# Patient Record
Sex: Female | Born: 1995
Health system: Southern US, Community
[De-identification: ages and names within clinical notes are randomized; demographics above are authoritative.]

## PROBLEM LIST (undated history)

## (undated) DIAGNOSIS — E282 Polycystic ovarian syndrome: Secondary | ICD-10-CM

## (undated) DIAGNOSIS — F329 Major depressive disorder, single episode, unspecified: Secondary | ICD-10-CM

## (undated) DIAGNOSIS — F32A Depression, unspecified: Secondary | ICD-10-CM

## (undated) DIAGNOSIS — E119 Type 2 diabetes mellitus without complications: Secondary | ICD-10-CM

## (undated) DIAGNOSIS — D649 Anemia, unspecified: Secondary | ICD-10-CM

## (undated) HISTORY — DX: Depression, unspecified: F32.A

## (undated) HISTORY — PX: WISDOM TOOTH EXTRACTION: SHX21

## (undated) HISTORY — DX: Anemia, unspecified: D64.9

---
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
## 2016-03-16 ENCOUNTER — Ambulatory Visit: Payer: Medicaid Other | Admitting: Certified Nurse Midwife

## 2016-04-01 ENCOUNTER — Ambulatory Visit (INDEPENDENT_AMBULATORY_CARE_PROVIDER_SITE_OTHER): Payer: Medicaid Other | Admitting: Certified Nurse Midwife

## 2016-04-01 ENCOUNTER — Encounter: Payer: Self-pay | Admitting: Certified Nurse Midwife

## 2016-04-01 VITALS — BP 123/82 | HR 57 | Temp 98.8°F | Wt 214.0 lb

## 2016-04-01 DIAGNOSIS — N939 Abnormal uterine and vaginal bleeding, unspecified: Secondary | ICD-10-CM

## 2016-04-01 DIAGNOSIS — E669 Obesity, unspecified: Secondary | ICD-10-CM | POA: Insufficient documentation

## 2016-04-01 DIAGNOSIS — E6609 Other obesity due to excess calories: Secondary | ICD-10-CM | POA: Diagnosis not present

## 2016-04-01 DIAGNOSIS — N946 Dysmenorrhea, unspecified: Secondary | ICD-10-CM

## 2016-04-01 DIAGNOSIS — O99210 Obesity complicating pregnancy, unspecified trimester: Secondary | ICD-10-CM | POA: Insufficient documentation

## 2016-04-01 MED ORDER — TRAMADOL HCL 50 MG PO TABS: 50.0000 mg | ORAL_TABLET | Freq: Four times a day (QID) | ORAL | 0 refills | Status: DC | PRN

## 2016-04-01 MED ORDER — IBUPROFEN 800 MG PO TABS: 800.0000 mg | ORAL_TABLET | Freq: Three times a day (TID) | ORAL | 1 refills | Status: DC | PRN

## 2016-04-01 NOTE — Progress Notes (Signed)
Patient ID: Andrea Black, female   DOB: January 25, 1996, 20 y.o.   MRN: [id number]  Chief Complaint  Patient presents with  . other    Patient is in office r/t painful menstrual cycles.    HPI Andrea Black is a 20 y.o. female.  Has been having increasingly painful menses since around May of 2017.  Last seen for annual exam 12/16.  Is in a homosexual relationship.  Desires future fertility.  States that her periods are still lasting 5-6 days with heavy bleeding for 5 days then abrupt stop on day 6, no tapering of bleeding.  Has extreme dysmenorrhea for the first 3 days of her cycle to where she is in bed.  States that she has tried aleve and heating pad for the pain without relief.  States that she had tried Midol in the past and that did not work.  Is having her cycles at the end of the month.  In June had extreme pain after her cycle, about a week later.  In August had two menses cycles at the start and end of the month roughly.  Reports diarrhea during menses cycles.    States that she has a BM every 3 days, sometimes sees blood in her stool.  Has never tried anything for constipation or seen a GI specialist.  Encouraged OTC Miralax and colace.  Increased water intake, fruits, fiber in her diet.  Drinks a gallon of juice every 3 days.  Encouraged a cup of juice a day.  Exercise encouraged.    HPI  No past medical history on file.  Past Surgical History:  Procedure Laterality Date  . WISDOM TOOTH EXTRACTION      Family History  Problem Relation Age of Onset  . Heart disease Father     Social History Social History  Substance Use Topics  . Smoking status: Never Smoker  . Smokeless tobacco: Never Used  . Alcohol use No    Allergies  Allergen Reactions  . Penicillins Anaphylaxis    Family  History per mom prefers pt not to have    Current Outpatient Prescriptions  Medication Sig Dispense Refill  . cholecalciferol (VITAMIN D) 1000 UNITS tablet Take 1,000 Units by mouth daily.  Reported on 06/18/2015    . ferrous fumarate (FERRO-SEQUELS) 50 MG CR tablet Take 50 mg by mouth 3 (three) times daily with meals. Reported on 06/18/2015    . ferrous fumarate (HEMOCYTE - 106 MG FE) 325 (106 FE) MG TABS tablet Take 1 tablet by mouth. Reported on 06/18/2015    . ibuprofen (ADVIL,MOTRIN) 800 MG tablet Take 1 tablet (800 mg total) by mouth every 8 (eight) hours as needed. 60 tablet 1  . metFORMIN (GLUCOPHAGE-XR) 750 MG 24 hr tablet Take 750 mg by mouth daily with breakfast. Reported on 06/18/2015    . traMADol (ULTRAM) 50 MG tablet Take 1 tablet (50 mg total) by mouth every 6 (six) hours as needed. 60 tablet 0  . vitamin C (ASCORBIC ACID) 500 MG tablet Take 500 mg by mouth daily. Reported on 06/18/2015     No current facility-administered medications for this visit.     Review of Systems Review of Systems Constitutional: negative for fatigue and weight loss Respiratory: negative for cough and wheezing Cardiovascular: negative for chest pain, fatigue and palpitations Gastrointestinal: negative for abdominal pain and change in bowel habits Genitourinary:negative Integument/breast: negative for nipple discharge Musculoskeletal:negative for myalgias Neurological: negative for gait problems and tremors Behavioral/Psych: negative for abusive relationship,  depression Endocrine: negative for temperature intolerance     Blood pressure 123/82, pulse (!) 57, temperature 98.8 F (37.1 C), temperature source Oral, weight 214 lb (97.1 kg), last menstrual period 03/22/2016.  Physical Exam Physical Exam General:   alert  Skin:   no rash or abnormalities  Lungs:   clear to auscultation bilaterally  Heart:   regular rate and rhythm, S1, S2 normal, no murmur, click, rub or gallop  Breasts:   deferred  Abdomen:  normal findings: no organomegaly, soft, non-tender and no hernia  Pelvis:  External genitalia: normal general appearance Urinary system: urethral meatus normal and bladder  without fullness, nontender Vaginal: normal without tenderness, induration or masses Cervix: normal appearance Adnexa: enlarged bilaterally Uterus: anteverted and non-tender, normal size    50% of 30 min visit spent on counseling and coordination of care.   Data Reviewed Previous medical hx, meds, labs  Assessment     AUB Dysmenorrhea Chronic constipation Obese    Plan    Orders Placed This Encounter  Procedures  . US Pelvis Complete    Standing Status:   Future    Standing Expiration Date:   06/01/2017    Order Specific Question:   Reason for Exam (SYMPTOM  OR DIAGNOSIS REQUIRED)    Answer:   AUB,dysmenorrhea    Order Specific Question:   Preferred imaging location?    Answer:   [facility]  . US Transvaginal Non-OB    Standing Status:   Future    Standing Expiration Date:   06/01/2017    Order Specific Question:   Reason for Exam (SYMPTOM  OR DIAGNOSIS REQUIRED)    Answer:   AUB. dysmenorrhea    Order Specific Question:   Preferred imaging location?    Answer:   [facility]  . Ambulatory referral to Gastroenterology    Referral Priority:   Routine    Referral Type:   Consultation    Referral Reason:   Specialty Services Required    Number of Visits Requested:   1   Meds ordered this encounter  Medications  . traMADol (ULTRAM) 50 MG tablet    Sig: Take 1 tablet (50 mg total) by mouth every 6 (six) hours as needed.    Dispense:  60 tablet    Refill:  0  . ibuprofen (ADVIL,MOTRIN) 800 MG tablet    Sig: Take 1 tablet (800 mg total) by mouth every 8 (eight) hours as needed.    Dispense:  60 tablet    Refill:  1    Possible management options include: Depo injections, CBC, CMP, LoLo Follow up after pelvic US.

## 2016-04-01 NOTE — Progress Notes (Signed)
Patient states that she has been having extremely painful menstrual cycles that seem to be getting worse each month and nothing seems to alleviate it.

## 2016-04-07 ENCOUNTER — Ambulatory Visit (HOSPITAL_COMMUNITY): Payer: Medicaid Other

## 2016-04-12 ENCOUNTER — Ambulatory Visit: Payer: Self-pay | Admitting: Physician Assistant

## 2016-04-12 ENCOUNTER — Ambulatory Visit (INDEPENDENT_AMBULATORY_CARE_PROVIDER_SITE_OTHER): Payer: Medicaid Other | Admitting: Certified Nurse Midwife

## 2016-04-12 ENCOUNTER — Ambulatory Visit (HOSPITAL_COMMUNITY)
Admission: RE | Admit: 2016-04-12 | Discharge: 2016-04-12 | Disposition: A | Discharge status: Home / Self Care | Payer: Medicaid Other | Source: Ambulatory Visit | Attending: Certified Nurse Midwife | Admitting: Certified Nurse Midwife

## 2016-04-12 VITALS — BP 112/76 | HR 63 | Wt 217.0 lb

## 2016-04-12 DIAGNOSIS — N939 Abnormal uterine and vaginal bleeding, unspecified: Secondary | ICD-10-CM | POA: Insufficient documentation

## 2016-04-12 DIAGNOSIS — E282 Polycystic ovarian syndrome: Secondary | ICD-10-CM

## 2016-04-12 DIAGNOSIS — N946 Dysmenorrhea, unspecified: Secondary | ICD-10-CM

## 2016-04-12 MED ORDER — METFORMIN HCL 500 MG/5ML PO SOLN: 5.0000 mL | Freq: Two times a day (BID) | ORAL | 0 refills | Status: DC

## 2016-04-12 MED ORDER — SPIRONOLACTONE 25 MG PO TABS: 25.0000 mg | ORAL_TABLET | Freq: Every day | ORAL | 12 refills | Status: DC

## 2016-04-12 MED ORDER — NORETHIN-ETH ESTRAD-FE BIPHAS 1 MG-10 MCG / 10 MCG PO TABS: 1.0000 | ORAL_TABLET | Freq: Every day | ORAL | 4 refills | Status: DC

## 2016-04-12 NOTE — Patient Instructions (Signed)
Polycystic Ovarian Syndrome  Polycystic ovarian syndrome (PCOS) is a common hormonal disorder among women of reproductive age. Most women with PCOS grow many small cysts on their ovaries. PCOS can cause problems with your periods and make it difficult to get pregnant. It can also cause an increased risk of miscarriage with pregnancy. If left untreated, PCOS can lead to serious health problems, such as diabetes and heart disease.  CAUSES  The cause of PCOS is not fully understood, but genetics may be a factor.  SIGNS AND SYMPTOMS   · Infrequent or no menstrual periods.    · Inability to get pregnant (infertility) because of not ovulating.    · Increased growth of hair on the face, chest, stomach, back, thumbs, thighs, or toes.    · Acne, oily skin, or dandruff.    · Pelvic pain.    · Weight gain or obesity, usually carrying extra weight around the waist.    · Type 2 diabetes.     · High cholesterol.    · High blood pressure.    · Female-pattern baldness or thinning hair.    · Patches of thickened and dark brown or black skin on the neck, arms, breasts, or thighs.    · Tiny excess flaps of skin (skin tags) in the armpits or neck area.    · Excessive snoring and having breathing stop at times while asleep (sleep apnea).    · Deepening of the voice.    · Gestational diabetes when pregnant.    DIAGNOSIS   There is no single test to diagnose PCOS.   · Your health care provider will:      Take a medical history.      Perform a pelvic exam.      Have ultrasonography done.      Check your female and female hormone levels.      Measure glucose or sugar levels in the blood.      Do other blood tests.    · If you are producing too many female hormones, your health care provider will make sure it is from PCOS. At the physical exam, your health care provider will want to evaluate the areas of increased hair growth. Try to allow natural hair growth for a few days before the visit.    · During a pelvic exam, the ovaries may be enlarged  or swollen because of the increased number of small cysts. This can be seen more easily by using vaginal ultrasonography or screening to examine the ovaries and lining of the uterus (endometrium) for cysts. The uterine lining may become thicker if you have not been having a regular period.    TREATMENT   Because there is no cure for PCOS, it needs to be managed to prevent problems. Treatments are based on your symptoms. Treatment is also based on whether you want to have a baby or whether you need contraception.   Treatment may include:   · Progesterone hormone to start a menstrual period.    · Birth control pills to make you have regular menstrual periods.    · Medicines to make you ovulate, if you want to get pregnant.    · Medicines to control your insulin.    · Medicine to control your blood pressure.    · Medicine and diet to control your high cholesterol and triglycerides in your blood.  · Medicine to reduce excessive hair growth.   · Surgery, making small holes in the ovary, to decrease the amount of female hormone production. This is done through a long, lighted tube (laparoscope) placed into the pelvis through a tiny incision in the lower abdomen.      HOME CARE INSTRUCTIONS  · Only take over-the-counter or prescription medicine as directed by your health care provider.  · Pay attention to the foods you eat and your activity levels. This can help reduce the effects of PCOS.    Keep your weight under control.    Eat foods that are low in carbohydrate and high in fiber.    Exercise regularly.  SEEK MEDICAL CARE IF:  · Your symptoms do not get better with medicine.  · You have new symptoms.     This information is not intended to replace advice given to you by your health care provider. Make sure you discuss any questions you have with your health care provider.     Document Released: 10/07/2004 Document Revised: 04/03/2013 Document Reviewed: 11/29/2012  Elsevier Interactive Patient Education ©2016 Elsevier  Inc.

## 2016-04-14 ENCOUNTER — Encounter: Payer: Self-pay | Admitting: Certified Nurse Midwife

## 2016-04-14 DIAGNOSIS — E282 Polycystic ovarian syndrome: Secondary | ICD-10-CM | POA: Insufficient documentation

## 2016-04-14 NOTE — Progress Notes (Signed)
Subjective:    Andrea Black is a 20 y.o. female who presents for contraception counseling and ultrasound results. The patient has no complaints today. The patient is in a homosexual relationship and is currently sexually active. Pertinent past medical history: none, does have DM2 and obesity.  The information documented in the HPI was reviewed and verified.  Menstrual History: OB History    Gravida Para Term Preterm AB Living   0 0 0 0 0 0   SAB TAB Ectopic Multiple Live Births   0 0 0 0        Menarche age[age] of age Patient's last menstrual period was 03/22/2016 (exact date).   Patient Active Problem List   Diagnosis Date Noted  . Obesity 04/01/2016  . Dysmenorrhea 04/01/2016  . Abnormal granulation tissue of buttock 08/05/2013   No past medical history on file.  Past Surgical History:  Procedure Laterality Date  . WISDOM TOOTH EXTRACTION       Current Outpatient Prescriptions:  .  cholecalciferol (VITAMIN D) 1000 UNITS tablet, Take 1,000 Units by mouth daily. Reported on 06/18/2015, Disp: , Rfl:  .  ferrous fumarate (FERRO-SEQUELS) 50 MG CR tablet, Take 50 mg by mouth 3 (three) times daily with meals. Reported on 06/18/2015, Disp: , Rfl:  .  ferrous fumarate (HEMOCYTE - 106 MG FE) 325 (106 FE) MG TABS tablet, Take 1 tablet by mouth. Reported on 06/18/2015, Disp: , Rfl:  .  ibuprofen (ADVIL,MOTRIN) 800 MG tablet, Take 1 tablet (800 mg total) by mouth every 8 (eight) hours as needed., Disp: 60 tablet, Rfl: 1 .  Metformin HCl 500 MG/5ML SOLN, Take 5 mLs (500 mg total) by mouth 2 (two) times daily., Disp: 473 mL, Rfl: 0 .  Norethindrone-Ethinyl Estradiol-Fe Biphas (LO LOESTRIN FE) 1 MG-10 MCG / 10 MCG tablet, Take 1 tablet by mouth daily. Take 1 tablet by mouth daily., Disp: 3 Package, Rfl: 4 .  spironolactone (ALDACTONE) 25 MG tablet, Take 1 tablet (25 mg total) by mouth daily., Disp: 30 tablet, Rfl: 12 .  traMADol (ULTRAM) 50 MG tablet, Take 1 tablet (50 mg total) by  mouth every 6 (six) hours as needed., Disp: 60 tablet, Rfl: 0 .  vitamin C (ASCORBIC ACID) 500 MG tablet, Take 500 mg by mouth daily. Reported on 06/18/2015, Disp: , Rfl:  Allergies  Allergen Reactions  . Penicillins Anaphylaxis    Family  History per mom prefers pt not to have    Social History  Substance Use Topics  . Smoking status: Never Smoker  . Smokeless tobacco: Never Used  . Alcohol use No    Family History  Problem Relation Age of Onset  . Heart disease Father        Review of Systems Constitutional: negative for weight loss Genitourinary:negative for abnormal menstrual periods and vaginal discharge   Objective:   BP 112/76   Pulse 63   Wt 217 lb (98.4 kg)   LMP 03/22/2016 (Exact Date)   BMI 40.34 kg/m    General:   alert  [place] results: CLINICAL DATA:  20 year old female with abnormal uterine bleeding and dysmenorrhea.  EXAM: TRANSABDOMINAL AND TRANSVAGINAL ULTRASOUND OF PELVIS  TECHNIQUE: Both transabdominal and transvaginal ultrasound examinations of the pelvis were performed. Transabdominal technique was performed for global imaging of the pelvis including uterus, ovaries, adnexal regions, and pelvic cul-de-sac. It was necessary to proceed with endovaginal exam following the transabdominal exam to visualize the ovaries and endometrium.  COMPARISON:  None  FINDINGS:  Uterus  Measurements: 7.4 x 3.5 x 4.4 cm. No fibroids or other mass visualized.  Endometrium  Thickness: 11 mm. No focal abnormality visualized. A small amount of fluid within the endometrial canal noted.  Right ovary  Measurements: 3.8 x 2.3 x 2.2 cm. Multiple small peripheral follicles noted with slightly hyperechoic ovarian stroma.  Left ovary  Measurements: 4.1 x 2.6 x 2.3 cm. Multiple small peripheral follicles noted with slightly hyperechoic ovarian stroma.  Other findings  Small amount of free fluid may be physiologic.  IMPRESSION: Multiple small  peripheral ovarian follicles with hyperechoic ovarian stroma which can be seen with polycystic ovarian syndrome. Correlate clinically.  No other significant abnormalities.   Electronically Signed   By: Margarette Canada M.D.   On: 04/12/2016 13:59   Lab Review Urine pregnancy test Labs reviewed yes Radiologic studies reviewed yes  90% of 20 min visit spent on counseling and coordination of care.   Assessment:    20 y.o., starting OCP (estrogen/progesterone), no contraindications.   New dx of PCOS  Plan:    All questions answered. Blood tests: Basic metabolic panel and PCOS panel, HGBA1C.  Meds ordered this encounter  Medications  . Metformin HCl 500 MG/5ML SOLN    Sig: Take 5 mLs (500 mg total) by mouth 2 (two) times daily.    Dispense:  473 mL    Refill:  0  . spironolactone (ALDACTONE) 25 MG tablet    Sig: Take 1 tablet (25 mg total) by mouth daily.    Dispense:  30 tablet    Refill:  12  . Norethindrone-Ethinyl Estradiol-Fe Biphas (LO LOESTRIN FE) 1 MG-10 MCG / 10 MCG tablet    Sig: Take 1 tablet by mouth daily. Take 1 tablet by mouth daily.    Dispense:  3 Package    Refill:  4    BIN # [id number], PCN# [id number], ID#[id number]   Orders Placed This Encounter  Procedures  . CBC with Differential/Platelet  . Comprehensive metabolic panel  . TSH  . Prolactin  . Testosterone, Free, Total, SHBG  . 17-Hydroxyprogesterone  . Progesterone  . Hemoglobin A1c   Need to obtain previous records Follow up in 3 months.

## 2016-04-15 LAB — TESTOSTERONE, FREE, TOTAL, SHBG
SEX HORMONE BINDING: 24.5 nmol/L — AB (ref 24.6–122.0)
TESTOSTERONE FREE: 3.1 pg/mL
Testosterone: 72 ng/dL

## 2016-04-15 LAB — COMPREHENSIVE METABOLIC PANEL
ALBUMIN: 4.3 g/dL (ref 3.5–5.5)
ALK PHOS: 60 IU/L (ref 39–117)
ALT: 9 IU/L (ref 0–32)
AST: 10 IU/L (ref 0–40)
Albumin/Globulin Ratio: 1.5 (ref 1.2–2.2)
BILIRUBIN TOTAL: 0.5 mg/dL (ref 0.0–1.2)
BUN / CREAT RATIO: 14 (ref 9–23)
BUN: 10 mg/dL (ref 6–20)
CO2: 26 mmol/L (ref 18–29)
CREATININE: 0.73 mg/dL (ref 0.57–1.00)
Calcium: 9.7 mg/dL (ref 8.7–10.2)
Chloride: 99 mmol/L (ref 96–106)
GFR calc non Af Amer: 120 mL/min/{1.73_m2} (ref >59)
GFR, EST AFRICAN AMERICAN: 138 mL/min/{1.73_m2} (ref >59)
GLOBULIN, TOTAL: 2.8 g/dL (ref 1.5–4.5)
Glucose: 204 mg/dL — ABNORMAL HIGH (ref 65–99)
Potassium: 4.3 mmol/L (ref 3.5–5.2)
SODIUM: 137 mmol/L (ref 134–144)
TOTAL PROTEIN: 7.1 g/dL (ref 6.0–8.5)

## 2016-04-15 LAB — CBC WITH DIFFERENTIAL/PLATELET
Basophils Absolute: 0 10*3/uL (ref 0.0–0.2)
Basos: 0 %
EOS (ABSOLUTE): 0.1 10*3/uL (ref 0.0–0.4)
EOS: 2 %
HEMATOCRIT: 33.1 % — AB (ref 34.0–46.6)
HEMOGLOBIN: 11.2 g/dL (ref 11.1–15.9)
IMMATURE GRANS (ABS): 0 10*3/uL (ref 0.0–0.1)
Immature Granulocytes: 0 %
LYMPHS ABS: 2.4 10*3/uL (ref 0.7–3.1)
LYMPHS: 38 %
MCH: 28.2 pg (ref 26.6–33.0)
MCHC: 33.8 g/dL (ref 31.5–35.7)
MCV: 83 fL (ref 79–97)
MONOCYTES: 6 %
Monocytes Absolute: 0.4 10*3/uL (ref 0.1–0.9)
NEUTROS ABS: 3.4 10*3/uL (ref 1.4–7.0)
Neutrophils: 54 %
Platelets: 260 10*3/uL (ref 150–379)
RBC: 3.97 x10E6/uL (ref 3.77–5.28)
RDW: 13.9 % (ref 12.3–15.4)
WBC: 6.3 10*3/uL (ref 3.4–10.8)

## 2016-04-15 LAB — HEMOGLOBIN A1C
ESTIMATED AVERAGE GLUCOSE: 194 mg/dL
HEMOGLOBIN A1C: 8.4 % — AB (ref 4.8–5.6)

## 2016-04-15 LAB — TSH: TSH: 2.66 u[IU]/mL (ref 0.450–4.500)

## 2016-04-15 LAB — PROGESTERONE: PROGESTERONE: 10.1 ng/mL

## 2016-04-15 LAB — PROLACTIN: Prolactin: 23.5 ng/mL — ABNORMAL HIGH (ref 4.8–23.3)

## 2016-04-15 LAB — 17-HYDROXYPROGESTERONE: 17-Hydroxyprogesterone: 247 ng/dL

## 2016-04-19 ENCOUNTER — Other Ambulatory Visit: Payer: Self-pay | Admitting: Certified Nurse Midwife

## 2016-04-19 DIAGNOSIS — R7989 Other specified abnormal findings of blood chemistry: Secondary | ICD-10-CM | POA: Insufficient documentation

## 2016-04-19 DIAGNOSIS — E229 Hyperfunction of pituitary gland, unspecified: Secondary | ICD-10-CM

## 2016-04-19 DIAGNOSIS — R7309 Other abnormal glucose: Secondary | ICD-10-CM | POA: Insufficient documentation

## 2016-04-19 DIAGNOSIS — E282 Polycystic ovarian syndrome: Secondary | ICD-10-CM

## 2016-05-29 ENCOUNTER — Emergency Department (HOSPITAL_COMMUNITY)
Admission: EM | Admit: 2016-05-29 | Discharge: 2016-05-29 | Disposition: A | Discharge status: Home / Self Care | Payer: Medicaid Other | Attending: Emergency Medicine | Admitting: Emergency Medicine

## 2016-05-29 ENCOUNTER — Encounter (HOSPITAL_COMMUNITY): Payer: Self-pay | Admitting: Emergency Medicine

## 2016-05-29 DIAGNOSIS — W010XXA Fall on same level from slipping, tripping and stumbling without subsequent striking against object, initial encounter: Secondary | ICD-10-CM | POA: Diagnosis not present

## 2016-05-29 DIAGNOSIS — Y929 Unspecified place or not applicable: Secondary | ICD-10-CM | POA: Insufficient documentation

## 2016-05-29 DIAGNOSIS — E119 Type 2 diabetes mellitus without complications: Secondary | ICD-10-CM | POA: Insufficient documentation

## 2016-05-29 DIAGNOSIS — Z79899 Other long term (current) drug therapy: Secondary | ICD-10-CM | POA: Insufficient documentation

## 2016-05-29 DIAGNOSIS — Y939 Activity, unspecified: Secondary | ICD-10-CM | POA: Diagnosis not present

## 2016-05-29 DIAGNOSIS — Z7984 Long term (current) use of oral hypoglycemic drugs: Secondary | ICD-10-CM | POA: Diagnosis not present

## 2016-05-29 DIAGNOSIS — M545 Low back pain, unspecified: Secondary | ICD-10-CM

## 2016-05-29 DIAGNOSIS — S46811A Strain of other muscles, fascia and tendons at shoulder and upper arm level, right arm, initial encounter: Secondary | ICD-10-CM

## 2016-05-29 DIAGNOSIS — Y999 Unspecified external cause status: Secondary | ICD-10-CM | POA: Insufficient documentation

## 2016-05-29 DIAGNOSIS — S4991XA Unspecified injury of right shoulder and upper arm, initial encounter: Secondary | ICD-10-CM | POA: Diagnosis present

## 2016-05-29 HISTORY — DX: Type 2 diabetes mellitus without complications: E11.9

## 2016-05-29 MED ORDER — DIAZEPAM 5 MG PO TABS
5.0000 mg | ORAL_TABLET | Freq: Once | ORAL | Status: AC
  Administered 2016-05-29: 5 mg via ORAL
  Filled 2016-05-29: qty 1

## 2016-05-29 MED ORDER — KETOROLAC TROMETHAMINE 60 MG/2ML IM SOLN
60.0000 mg | Freq: Once | INTRAMUSCULAR | Status: AC
  Administered 2016-05-29: 60 mg via INTRAMUSCULAR
  Filled 2016-05-29: qty 2

## 2016-05-29 MED ORDER — OXYCODONE HCL 5 MG PO TABS
5.0000 mg | ORAL_TABLET | Freq: Once | ORAL | Status: AC
  Administered 2016-05-29: 5 mg via ORAL
  Filled 2016-05-29: qty 1

## 2016-05-29 MED ORDER — ACETAMINOPHEN 500 MG PO TABS
1000.0000 mg | ORAL_TABLET | Freq: Once | ORAL | Status: AC
  Administered 2016-05-29: 1000 mg via ORAL
  Filled 2016-05-29: qty 2

## 2016-05-29 NOTE — Discharge Instructions (Signed)
Take 4 over the counter ibuprofen tablets 3 times a day or 2 over-the-counter naproxen tablets twice a day for pain. Also take tylenol 1000mg(2 extra strength) four times a day.    

## 2016-05-29 NOTE — ED Triage Notes (Signed)
Patient c/o lower back pain for several months, believes side effect to one of her medications. patient also fell few days ago after slipping and c/o upper back pain.

## 2016-05-29 NOTE — ED Provider Notes (Signed)
[place] [facility] DEPT Provider Note   CSN: [id number] Arrival date & time: 05/29/16  1150     History   Chief Complaint Chief Complaint  Patient presents with  . Back Pain    HPI Andrea Black is a 20 y.o. female.  20 yo F with a chief complaints of back pain. This pain is to the right lower lumbar region. This been going on for the past couple weeks. This happened after she easily moved. Worse with movement palpation. Patient has been laying around and has not improved. Took one ibuprofen tablet with no improvement. Denies urinary symptoms denies fevers. [place] a few days ago complaining of upper back pain   The history is provided by the patient.  Back Pain   This is a new problem. The current episode started more than 1 week ago. The problem occurs constantly. The problem has not changed since onset.The pain is associated with no known injury. The pain is present in the thoracic spine and lumbar spine. The quality of the pain is described as shooting and stabbing. The pain does not radiate. The pain is at a severity of 7/10. The pain is moderate. The symptoms are aggravated by bending, twisting and certain positions. Pertinent negatives include no chest pain, no fever, no headaches and no dysuria. She has tried NSAIDs for the symptoms. The treatment provided no relief. Risk factors include obesity, a sedentary lifestyle, poor posture and lack of exercise.    Past Medical History:  Diagnosis Date  . Diabetes mellitus without complication [facility])     Patient Active Problem List   Diagnosis Date Noted  . Elevated hemoglobin A1c 04/19/2016  . Prolactin increased ([place]) 04/19/2016  . Polycystic ovarian disease 04/14/2016  . Obesity 04/01/2016  . Dysmenorrhea 04/01/2016  . Abnormal granulation tissue of buttock 08/05/2013    Past Surgical History:  Procedure Laterality Date  . WISDOM TOOTH EXTRACTION      OB History    Gravida Para Term Preterm AB Living   0 0 0 0 0 0   SAB  TAB Ectopic Multiple Live Births   0 0 0 0         Home Medications    Prior to Admission medications   Medication Sig Start Date End Date Taking? Authorizing Provider  cholecalciferol (VITAMIN D) 1000 UNITS tablet Take 1,000 Units by mouth daily. Reported on 06/18/2015    Historical Provider, MD  ferrous fumarate (FERRO-SEQUELS) 50 MG CR tablet Take 50 mg by mouth 3 (three) times daily with meals. Reported on 06/18/2015    Historical Provider, MD  ferrous fumarate (HEMOCYTE - 106 MG FE) 325 (106 FE) MG TABS tablet Take 1 tablet by mouth. Reported on 06/18/2015    Historical Provider, MD  ibuprofen (ADVIL,MOTRIN) 800 MG tablet Take 1 tablet (800 mg total) by mouth every 8 (eight) hours as needed. 04/01/16   Rachelle A Denney, CNM  Metformin HCl 500 MG/5ML SOLN Take 5 mLs (500 mg total) by mouth 2 (two) times daily. 04/12/16   Rachelle A Denney, CNM  Norethindrone-Ethinyl Estradiol-Fe Biphas (LO LOESTRIN FE) 1 MG-10 MCG / 10 MCG tablet Take 1 tablet by mouth daily. Take 1 tablet by mouth daily. 04/12/16   Rachelle A Denney, CNM  spironolactone (ALDACTONE) 25 MG tablet Take 1 tablet (25 mg total) by mouth daily. 04/12/16   Rachelle A Denney, CNM  traMADol (ULTRAM) 50 MG tablet Take 1 tablet (50 mg total) by mouth every 6 (six) hours as needed. 04/01/16  Rachelle A Denney, CNM  vitamin C (ASCORBIC ACID) 500 MG tablet Take 500 mg by mouth daily. Reported on 06/18/2015    Historical Provider, MD    Family History Family History  Problem Relation Age of Onset  . Heart disease Father     Social History Social History  Substance Use Topics  . Smoking status: Never Smoker  . Smokeless tobacco: Never Used  . Alcohol use No     Allergies   Penicillins   Review of Systems Review of Systems  Constitutional: Negative for chills and fever.  HENT: Negative for congestion and rhinorrhea.   Eyes: Negative for redness and visual disturbance.  Respiratory: Negative for shortness of breath and  wheezing.   Cardiovascular: Negative for chest pain and palpitations.  Gastrointestinal: Negative for nausea and vomiting.  Genitourinary: Negative for dysuria and urgency.  Musculoskeletal: Positive for back pain. Negative for arthralgias and myalgias.  Skin: Negative for pallor and wound.  Neurological: Negative for dizziness and headaches.     Physical Exam Updated Vital Signs BP 114/76 (BP Location: Right Arm)   Pulse 67   Temp 98.4 F (36.9 C) (Oral)   Resp 17   Ht 5\' 2"  (1.575 m)   Wt 215 lb (97.5 kg)   LMP 05/23/2016   SpO2 100%   BMI 39.32 kg/m   Physical Exam  Constitutional: She is oriented to person, place, and time. She appears well-developed and well-nourished. No distress.  HENT:  Head: Normocephalic and atraumatic.  Eyes: EOM are normal. Pupils are equal, round, and reactive to light.  Neck: Normal range of motion. Neck supple.  Cardiovascular: Normal rate and regular rhythm.  Exam reveals no gallop and no friction rub.   No murmur heard. Pulmonary/Chest: Effort normal. She has no wheezes. She has no rales.  Abdominal: Soft. She exhibits no distension and no mass. There is no tenderness. There is no guarding.  Musculoskeletal: She exhibits no edema or tenderness.       Arms: Negative straight leg raise test bilaterally. Ambulates without difficulty.  Neurological: She is alert and oriented to person, place, and time.  Skin: Skin is warm and dry. She is not diaphoretic.  Psychiatric: She has a normal mood and affect. Her behavior is normal.  Nursing note and vitals reviewed.    ED Treatments / Results  Labs (all labs ordered are listed, but only abnormal results are displayed) Labs Reviewed - No data to display  EKG  EKG Interpretation None       Radiology No results found.  Procedures Procedures (including critical care time)  Medications Ordered in ED Medications - No data to display   Initial Impression / Assessment and Plan / ED  Course  I have reviewed the triage vital signs and the nursing notes.  Pertinent labs & imaging results that were available during my care of the patient were reviewed by me and considered in my medical decision making (see chart for details).  Clinical Course     19 yo F With a chief complaint of back pain. This is been an ongoing issue for the past month or so. No recent injury to the low back patient did fall and hurt her upper back. This pain is not midline is more lateral. Do not feel that imaging is warranted at this time. Discussed return to normal activity with the patient. NSAIDs and Tylenol. PCP follow-up.  12:53 PM:  I have discussed the diagnosis/risks/treatment options with the patient and family and  believe the pt to be eligible for discharge home to follow-up with PCP. We also discussed returning to the ED immediately if new or worsening sx occur. We discussed the sx which are most concerning (e.g., sudden worsening pain, fever, inability to tolerate by mouth) that necessitate immediate return. Medications administered to the patient during their visit and any new prescriptions provided to the patient are listed below.  Medications given during this visit Medications - No data to display   The patient appears reasonably screen and/or stabilized for discharge and I doubt any other medical condition or other [facility] requiring further screening, evaluation, or treatment in the ED at this time prior to discharge.    Final Clinical Impressions(s) / ED Diagnoses   Final diagnoses:  Acute right-sided low back pain without sciatica  Trapezius strain, right, initial encounter    New Prescriptions New Prescriptions   No medications on file     Deno Etienne, DO 05/29/16 1253

## 2016-06-22 ENCOUNTER — Ambulatory Visit (INDEPENDENT_AMBULATORY_CARE_PROVIDER_SITE_OTHER): Payer: Medicaid Other | Admitting: Certified Nurse Midwife

## 2016-06-22 ENCOUNTER — Encounter: Payer: Self-pay | Admitting: Certified Nurse Midwife

## 2016-06-22 VITALS — BP 114/80 | HR 76 | Ht 61.5 in | Wt 219.0 lb

## 2016-06-22 DIAGNOSIS — Z01419 Encounter for gynecological examination (general) (routine) without abnormal findings: Secondary | ICD-10-CM

## 2016-06-22 DIAGNOSIS — E282 Polycystic ovarian syndrome: Secondary | ICD-10-CM

## 2016-06-22 DIAGNOSIS — Z0000 Encounter for general adult medical examination without abnormal findings: Secondary | ICD-10-CM | POA: Diagnosis not present

## 2016-06-22 MED ORDER — NORGESTIMATE-ETH ESTRADIOL 0.25-35 MG-MCG PO TABS: 1.0000 | ORAL_TABLET | Freq: Every day | ORAL | 11 refills | Status: DC

## 2016-06-22 MED ORDER — METFORMIN HCL 500 MG/5ML PO SOLN: 5.0000 mL | Freq: Every day | ORAL | 2 refills | Status: DC

## 2016-06-22 NOTE — Progress Notes (Signed)
Subjective:        Andrea Black is a 20 y.o. female here for a routine exam.  Current complaints: none.  In a homosexual relationship, recent dx of PCOS on [id number] with complications of obesity and DM2.  Was started on LoLo 04/12/16, along with Metformin and Spironolactone.  Is not having regular periods with LoLo, is not dieting, not exercising.  Occasionally taking spironolactone.  Is not taking her metformin.   Reports diarrhea with metformin.  Reports bruising easily with spironolactone.     Personal health questionnaire:  Is patient Ashkenazi Jewish, have a family history of breast and/or ovarian cancer: no Is there a family history of uterine cancer diagnosed at age < 49, gastrointestinal cancer, urinary tract cancer, family member who is a Field seismologist syndrome-associated carrier: no Is the patient overweight and hypertensive, family history of diabetes, personal history of gestational diabetes, preeclampsia or PCOS: yes Is patient over 64, have PCOS,  family history of premature CHD under age 17, diabetes, smoke, have hypertension or peripheral artery disease:  yes At any time, has a partner hit, kicked or otherwise hurt or frightened you?: no Over the past 2 weeks, have you felt down, depressed or hopeless?: no Over the past 2 weeks, have you felt little interest or pleasure in doing things?:no   Gynecologic History Patient's last menstrual period was 05/23/2016. Contraception: OCP (estrogen/progesterone) Last Pap: n/a.  Last mammogram: n/a.   Obstetric History OB History  Gravida Para Term Preterm AB Living  0 0 0 0 0 0  SAB TAB Ectopic Multiple Live Births  0 0 0 0          Past Medical History:  Diagnosis Date  . Diabetes mellitus without complication [facility])     Past Surgical History:  Procedure Laterality Date  . WISDOM TOOTH EXTRACTION       Current Outpatient Prescriptions:  .  ibuprofen (ADVIL,MOTRIN) 800 MG tablet, Take 1 tablet (800 mg total) by mouth every  8 (eight) hours as needed., Disp: 60 tablet, Rfl: 1 .  spironolactone (ALDACTONE) 25 MG tablet, Take 1 tablet (25 mg total) by mouth daily., Disp: 30 tablet, Rfl: 12 .  Metformin HCl 500 MG/5ML SOLN, Take 5 mLs (500 mg total) by mouth daily., Disp: 473 mL, Rfl: 2 .  norgestimate-ethinyl estradiol (SPRINTEC 28) 0.25-35 MG-MCG tablet, Take 1 tablet by mouth daily., Disp: 1 Package, Rfl: 11 Allergies  Allergen Reactions  . Penicillins Anaphylaxis    Family  History per mom prefers pt not to have    Social History  Substance Use Topics  . Smoking status: Never Smoker  . Smokeless tobacco: Never Used  . Alcohol use No    Family History  Problem Relation Age of Onset  . Heart disease Father       Review of Systems  Constitutional: negative for fatigue and weight loss Respiratory: negative for cough and wheezing Cardiovascular: negative for chest pain, fatigue and palpitations Gastrointestinal: negative for abdominal pain and change in bowel habits Musculoskeletal:negative for myalgias Neurological: negative for gait problems and tremors Behavioral/Psych: negative for abusive relationship, depression Endocrine: negative for temperature intolerance    Genitourinary:negative for abnormal menstrual periods, genital lesions, hot flashes, sexual problems and vaginal discharge Integument/breast: negative for breast lump, breast tenderness, nipple discharge and skin lesion(s)    Objective:       BP 114/80   Pulse 76   Ht 5' 1.5" (1.562 m)   Wt 219 lb (99.3 kg)  LMP 05/23/2016 Comment: pt states 2 week cycle in November  BMI 40.71 kg/m  General:   alert  Skin:   no rash or abnormalities  Lungs:   clear to auscultation bilaterally  Heart:   regular rate and rhythm, S1, S2 normal, no murmur, click, rub or gallop  Breasts:   normal without suspicious masses, skin or nipple changes or axillary nodes  Abdomen:  normal findings: no organomegaly, soft, non-tender and no hernia  Pelvis:   declined   Lab Review Urine pregnancy test Labs reviewed yes Radiologic studies reviewed yes  50% of 30 min visit spent on counseling and coordination of care.    Assessment:    Healthy female exam.   Declines STD screening  PCOS: uncontrolled  Medical non-compliance  Plan:    Education reviewed: calcium supplements, depression evaluation, low fat, low cholesterol diet, safe sex/STD prevention, self breast exams, skin cancer screening and weight bearing exercise. Contraception: OCP (estrogen/progesterone). Follow up in: 3 months.   Meds ordered this encounter  Medications  . norgestimate-ethinyl estradiol (SPRINTEC 28) 0.25-35 MG-MCG tablet    Sig: Take 1 tablet by mouth daily.    Dispense:  1 Package    Refill:  11  . Metformin HCl 500 MG/5ML SOLN    Sig: Take 5 mLs (500 mg total) by mouth daily.    Dispense:  473 mL    Refill:  2   No orders of the defined types were placed in this encounter.

## 2016-08-29 ENCOUNTER — Other Ambulatory Visit: Payer: Self-pay | Admitting: Certified Nurse Midwife

## 2016-08-29 DIAGNOSIS — N946 Dysmenorrhea, unspecified: Secondary | ICD-10-CM

## 2016-08-29 DIAGNOSIS — N939 Abnormal uterine and vaginal bleeding, unspecified: Secondary | ICD-10-CM

## 2016-10-12 ENCOUNTER — Other Ambulatory Visit: Payer: Self-pay | Admitting: Obstetrics

## 2016-10-12 DIAGNOSIS — N946 Dysmenorrhea, unspecified: Secondary | ICD-10-CM

## 2016-10-12 DIAGNOSIS — N939 Abnormal uterine and vaginal bleeding, unspecified: Secondary | ICD-10-CM

## 2017-02-15 ENCOUNTER — Ambulatory Visit (INDEPENDENT_AMBULATORY_CARE_PROVIDER_SITE_OTHER): Payer: Medicaid Other | Admitting: Obstetrics and Gynecology

## 2017-02-15 ENCOUNTER — Other Ambulatory Visit (HOSPITAL_COMMUNITY)
Admission: RE | Admit: 2017-02-15 | Discharge: 2017-02-15 | Disposition: A | Discharge status: Home / Self Care | Payer: Medicaid Other | Source: Ambulatory Visit | Attending: Obstetrics and Gynecology | Admitting: Obstetrics and Gynecology

## 2017-02-15 ENCOUNTER — Encounter: Payer: Self-pay | Admitting: Obstetrics and Gynecology

## 2017-02-15 DIAGNOSIS — Z202 Contact with and (suspected) exposure to infections with a predominantly sexual mode of transmission: Secondary | ICD-10-CM | POA: Diagnosis present

## 2017-02-15 DIAGNOSIS — Z3202 Encounter for pregnancy test, result negative: Secondary | ICD-10-CM | POA: Diagnosis not present

## 2017-02-15 LAB — POCT URINE PREGNANCY: PREG TEST UR: NEGATIVE

## 2017-02-15 NOTE — Progress Notes (Signed)
Pt presents for outer vaginal itching x 2 wks only at bedtime. Has tried ice and apple cider vinegar with relief but itching returned the next day. Pt denies abnormal discharge.   As noted above. Same partner x 1 month. Last IC this past weekend. OCP's and condoms. Recent change in soap. Desires STD testing as well d/t to recent new partner  PE Af VSS Lungs clear Heart RRR GU evidence of ext genitalia irritation noted, scant white vaginal discharge   A/P STD exposure        Vulvar irritation  Cultures and blood ordered today. Suspect Sx are related to recent change in soap. Pt advised to return to her prior soap. F/U o/w per test results

## 2017-02-16 LAB — CERVICOVAGINAL ANCILLARY ONLY
Bacterial vaginitis: NEGATIVE
CANDIDA VAGINITIS: POSITIVE — AB
CHLAMYDIA, DNA PROBE: NEGATIVE
Neisseria Gonorrhea: NEGATIVE
Trichomonas: NEGATIVE

## 2017-02-16 LAB — HIV ANTIBODY (ROUTINE TESTING W REFLEX): HIV Screen 4th Generation wRfx: NONREACTIVE

## 2017-02-16 LAB — RPR: RPR Ser Ql: NONREACTIVE

## 2017-02-16 LAB — HEPATITIS B SURFACE ANTIGEN: Hepatitis B Surface Ag: NEGATIVE

## 2017-02-16 LAB — HEPATITIS C ANTIBODY

## 2017-02-17 ENCOUNTER — Other Ambulatory Visit: Payer: Self-pay

## 2017-02-17 ENCOUNTER — Telehealth: Payer: Self-pay

## 2017-02-17 DIAGNOSIS — B373 Candidiasis of vulva and vagina: Secondary | ICD-10-CM

## 2017-02-17 DIAGNOSIS — B3731 Acute candidiasis of vulva and vagina: Secondary | ICD-10-CM

## 2017-02-17 MED ORDER — FLUCONAZOLE 150 MG PO TABS: 150.0000 mg | ORAL_TABLET | ORAL | 0 refills | Status: DC

## 2017-02-17 NOTE — Telephone Encounter (Signed)
Message left on VM to pick up RX.

## 2017-02-17 NOTE — Telephone Encounter (Signed)
-----   Message from Chancy Milroy, MD sent at 02/17/2017  8:23 AM EDT ----- Diflucan 150 mg po x 1 now and repeat in 3 days Thanks Legrand Como

## 2017-02-20 ENCOUNTER — Other Ambulatory Visit: Payer: Self-pay | Admitting: Obstetrics

## 2017-02-20 DIAGNOSIS — N946 Dysmenorrhea, unspecified: Secondary | ICD-10-CM

## 2017-02-20 DIAGNOSIS — N939 Abnormal uterine and vaginal bleeding, unspecified: Secondary | ICD-10-CM

## 2017-03-22 ENCOUNTER — Encounter (HOSPITAL_COMMUNITY): Payer: Self-pay | Admitting: *Deleted

## 2017-03-22 ENCOUNTER — Ambulatory Visit (HOSPITAL_COMMUNITY)
Admission: EM | Admit: 2017-03-22 | Discharge: 2017-03-22 | Disposition: A | Discharge status: Home / Self Care | Payer: Medicaid Other | Attending: Family Medicine | Admitting: Family Medicine

## 2017-03-22 DIAGNOSIS — M940 Chondrocostal junction syndrome [Tietze]: Secondary | ICD-10-CM | POA: Diagnosis not present

## 2017-03-22 HISTORY — DX: Polycystic ovarian syndrome: E28.2

## 2017-03-22 NOTE — ED Triage Notes (Signed)
Pt  Reports  Chest  Pain   And   Drowsy   With   Onset  Of   Symptoms   Today

## 2017-03-22 NOTE — ED Triage Notes (Signed)
Pt   Developed  Chest  Pain     About       X  1     Hour          Pt   Reports  She  Was  Walking  And  The  Pain hit  Her  Mid   Stride  She  Reports   Pt  Stopped   Her  Metformin   For  Diabetes

## 2017-03-23 NOTE — ED Provider Notes (Signed)
  [facility]   [id number] 03/22/17 Arrival Time: 4970  ASSESSMENT & PLAN:  1. Costochondritis    OTC ibuprofen for a few days and f/u if not improving. Reviewed expectations re: course of current medical issues. Questions answered. Outlined signs and symptoms indicating need for more acute intervention. Patient verbalized understanding. After Visit Summary given.   SUBJECTIVE:  ARAYNA ILLESCAS is a 21 y.o. female who presents with complaint of anterior chest discomfort. Abrupt onset today. Worse with certain movements. No associated n/v/SOB. Occasional cough. Wondering if she is catching a cold. No h/o similar. No self treatment. No back or abdominal pain. Normal PO intake. Ambulatory without difficulty. No specific aggravating or alleviating factors reported. Afebrile.  ROS: As per HPI.   OBJECTIVE:  Vitals:   03/22/17 1659  BP: 108/70  Pulse: 78  Resp: 18  Temp: 98.6 F (37 C)  TempSrc: Oral  SpO2: 100%    General appearance: alert; no distress Eyes: PERRLA; EOMI; conjunctiva normal HENT: normocephalic; atraumatic; TMs normal; nasal mucosa normal; oral mucosa normal Neck: supple Chest wall: tender anteriorly (reproduces discomfort described) Lungs: clear to auscultation bilaterally Heart: regular rate and rhythm Abdomen: soft, non-tender; bowel sounds normal; no masses or organomegaly; no guarding or rebound tenderness Extremities: no cyanosis or edema; symmetrical with no gross deformities Skin: warm and dry Psychological: alert and cooperative; normal mood and affect   Allergies  Allergen Reactions  . Penicillins Anaphylaxis    Family  History per mom prefers pt not to have    Past Medical History:  Diagnosis Date  . Diabetes mellitus without complication ([place])   . Polycystic ovary    Social History   Social History  . Marital status: Single    Spouse name: N/A  . Number of children: N/A  . Years of education: N/A   Occupational History    . Not on file.   Social History Main Topics  . Smoking status: Never Smoker  . Smokeless tobacco: Never Used  . Alcohol use No  . Drug use: No  . Sexual activity: Yes    Birth control/ protection: Abstinence   Other Topics Concern  . Not on file   Social History Narrative  . No narrative on file   Family History  Problem Relation Age of Onset  . Colon cancer Mother   . Heart disease Father    Past Surgical History:  Procedure Laterality Date  . WISDOM TOOTH EXTRACTION       Vanessa Kick, MD 03/23/17 [phone]

## 2017-05-11 ENCOUNTER — Ambulatory Visit (HOSPITAL_COMMUNITY)
Admission: EM | Admit: 2017-05-11 | Discharge: 2017-05-11 | Disposition: A | Discharge status: Home / Self Care | Payer: Medicaid Other

## 2017-05-11 ENCOUNTER — Encounter (HOSPITAL_COMMUNITY): Payer: Self-pay | Admitting: Family Medicine

## 2017-05-11 DIAGNOSIS — M940 Chondrocostal junction syndrome [Tietze]: Secondary | ICD-10-CM

## 2017-05-11 NOTE — ED Triage Notes (Signed)
Pt here for chest pain with radiation into left shoulder. Reports that she was seen here over a month ago with the same and took Ibuprofen with relief of pain but it has returned. Hurts with breathing, movement. Took tylenol last night without relief. Denies injuries. Denies dizziness N,V,SOB.

## 2017-05-11 NOTE — ED Provider Notes (Addendum)
[facility]    CSN: [id number] Arrival date & time: 05/11/17  1018     History   Chief Complaint Chief Complaint  Patient presents with  . Chest Pain    HPI Andrea Black is a 21 y.o. female with history of PCOS presenting today for evaluation of chest pain. She has been experiencing a constant "nagging" pain in her left chest and left arm for about one week. Over the week the pain has worsened and moved from her chest to her arm. The pain is worse with movement and breathing and she feels it is muscular. She was seen here 1 month ago for similar pain and was treated for costochondritis, given ibuprofen 800 mg TID and her pain was relieved in about 1 week. She took 500 mg tylenol last night and did not experience relief. Her most recent illness was a cold 1 month ago where she endorses frequent coughing. She denies association with eating, cough, nausea, vomiting, no pain with swallowing, shortness of breath, cough, congestion, abdominal pain. She does endorse weakness in her right arm. Denies any trauma or accidents to her chest.  She has a family history of Diabetes with her Mother, and unknown heart issue in her father. She reports her father passed away from what sounds like a possible arrhythmia. Her brother had an MI at age 73.   She denies smoking. 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   HPI  Past Medical History:  Diagnosis Date  . Diabetes mellitus without complication ([place])   . Polycystic ovary     Patient Active Problem List   Diagnosis Date Noted  . Exposure to sexually transmitted disease (STD) 02/15/2017  . Elevated hemoglobin A1c 04/19/2016  . Prolactin increased ([place]) 04/19/2016  . Polycystic ovarian disease 04/14/2016  . Obesity 04/01/2016  . Dysmenorrhea 04/01/2016  . Abnormal granulation tissue of buttock 08/05/2013    Past Surgical History:  Procedure  Laterality Date  . WISDOM TOOTH EXTRACTION      OB History    Gravida Para Term Preterm AB Living   0 0 0 0 0 0   SAB TAB Ectopic Multiple Live Births   0 0 0 0         Home Medications    Prior to Admission medications   Medication Sig Start Date End Date Taking? Authorizing Provider  fluconazole (DIFLUCAN) 150 MG tablet Take 1 tablet (150 mg total) by mouth as directed. 150 mg po x 1 and repeat in three days. 02/17/17   Chancy Milroy, MD  ibuprofen (ADVIL,MOTRIN) 800 MG tablet TAKE 1 TABLET BY MOUTH EVERY 8 HOURS AS NEEDED 02/20/17   Shelly Bombard, MD    Family History Family History  Problem Relation Age of Onset  . Colon cancer Mother   . Heart disease Father     Social History Social History   Tobacco Use  . Smoking status: Never Smoker  . Smokeless tobacco: Never Used  Substance Use Topics  . Alcohol use: No    Alcohol/week: 0.0 oz  . Drug use: No     Allergies   Penicillins   Review of Systems Review of Systems  Constitutional: Negative for diaphoresis and fever.  HENT: Negative for congestion, sore throat and trouble swallowing.   Respiratory: Negative for cough and shortness of breath.   Cardiovascular: Positive for chest pain. Negative for palpitations.  Gastrointestinal: Negative for abdominal pain.  Musculoskeletal: Positive for back pain and neck pain.  Neurological: Positive for weakness.     Physical Exam Triage Vital Signs ED Triage Vitals  Enc Vitals Group     BP 05/11/17 1036 112/69     Pulse Rate 05/11/17 1036 61     Resp 05/11/17 1036 16     Temp 05/11/17 1036 98.5 F (36.9 C)     Temp src --      SpO2 05/11/17 1036 100 %     Weight --      Height --      Head Circumference --      Peak Flow --      Pain Score 05/11/17 1034 7     Pain Loc --      Pain Edu? --      Excl. in [place]? --    No data found.  Updated Vital Signs BP 112/69 (BP Location: Right Arm)   Pulse 61   Temp 98.5 F (36.9 C)   Resp 16   LMP  04/05/2017   SpO2 100%      Physical Exam  Constitutional: She appears well-developed and well-nourished.  HENT:  Head: Normocephalic and atraumatic.  Eyes: Pupils are equal, round, and reactive to light.  Neck: Normal range of motion. Neck supple.  Cardiovascular: Normal rate, regular rhythm and normal pulses.  Pulmonary/Chest: Breath sounds normal. She has no wheezes. She has no rhonchi.  Left chest diffusely tender to palpation, pain reproducible, no crepitus   Abdominal: Soft. She exhibits no distension. There is no tenderness.  Musculoskeletal:  Left arm strength 5/5, full active range of motion, tenderness to palpation over deltoid muscle. Left scapula tender to palpation.  Skin: Skin is warm and dry.     UC Treatments / Results  Labs (all labs ordered are listed, but only abnormal results are displayed) Labs Reviewed - No data to display  EKG  EKG Interpretation None       Radiology No results found.  Procedures Procedures (including critical care time)  Medications Ordered in UC Medications - No data to display   Initial Impression / Assessment and Plan / UC Course  I have reviewed the triage vital signs and the nursing notes.  Pertinent labs & imaging results that were available during my care of the patient were reviewed by me and considered in my medical decision making (see chart for details).   Given the patients pain was reproducible on exam, duration of pain, and lack of exertional symptoms she does appear to be experiencing an acute episode related to the heart. Lung exam was clear and denies any pulmonary symptoms that may be attributed to a pulmonary cause. It appears to be more musculoskeletal. She does not have hypertension, she has been diagnosed with PCOS and Diabetes, but is not on metformin. EKG and CXR was deferred.   She was prescribed Ibuprofen 800 mg by her OBGYN and has this at home, but does not currently take. She was advised to take  this Ibuprofen 800 mg that she already has TID for 1 weeks and monitor for improvement. She has 5 refills of this so no prescription was given today. Patient concerned about ibuprofen causing ulcers, educated to take with food and do not taken more than what is prescribed or more frequently.   She was encouraged to establish care with a PCP to be evaluated for her risk of heart disease given her family history.  Patient was educated on signs and symptoms of heart attack to include nausea/vomitting, diaphoresis, shortness of breath, sudden worsening of chest pain and if she experiences any of these symptoms to report to the ED.   Final Clinical Impressions(s) / UC Diagnoses   Final diagnoses:  Costochondritis    ED Discharge Orders    None       Controlled Substance Prescriptions [place] Controlled Substance Registry consulted? Not Applicable   Janith Lima, PA-C 05/11/17 1228    Janith Lima, [place] 05/11/17 1647

## 2017-05-11 NOTE — Discharge Instructions (Signed)
Take Ibuprofen 800 mg three times a day for one week. Use the current prescription you have. Follow up if symptoms do not resolve or worsen. Go to ED if experiencing symptoms of sudden worsening of chest pain, nausea/vomitting, shortness of breath, sweating.  I recommend establishing a primary care provider to evaluate risk for heart disease.

## 2017-09-18 ENCOUNTER — Encounter: Payer: Self-pay | Admitting: *Deleted

## 2018-01-16 ENCOUNTER — Emergency Department (HOSPITAL_COMMUNITY): Payer: Medicaid Other

## 2018-01-16 ENCOUNTER — Other Ambulatory Visit: Payer: Self-pay

## 2018-01-16 ENCOUNTER — Encounter (HOSPITAL_COMMUNITY): Payer: Self-pay | Admitting: *Deleted

## 2018-01-16 DIAGNOSIS — E119 Type 2 diabetes mellitus without complications: Secondary | ICD-10-CM | POA: Insufficient documentation

## 2018-01-16 DIAGNOSIS — Z79899 Other long term (current) drug therapy: Secondary | ICD-10-CM | POA: Insufficient documentation

## 2018-01-16 DIAGNOSIS — R0789 Other chest pain: Secondary | ICD-10-CM | POA: Insufficient documentation

## 2018-01-16 NOTE — ED Triage Notes (Signed)
Pt arrives with c/o right sided chest pain that radiates down the right arm Pt reports intermittent similar cp for several months, diagnosed with costochondritis. Onset about 1 hour ago while just standing up. Denies any recent cough, fevers or injury.

## 2018-01-17 ENCOUNTER — Emergency Department (HOSPITAL_COMMUNITY)
Admission: EM | Admit: 2018-01-17 | Discharge: 2018-01-17 | Disposition: A | Discharge status: Home / Self Care | Payer: Medicaid Other | Attending: Emergency Medicine | Admitting: Emergency Medicine

## 2018-01-17 DIAGNOSIS — R079 Chest pain, unspecified: Secondary | ICD-10-CM

## 2018-01-17 LAB — I-STAT CHEM 8, ED
BUN: 8 mg/dL (ref 6–20)
CALCIUM ION: 1.16 mmol/L (ref 1.15–1.40)
CHLORIDE: 102 mmol/L (ref 98–111)
Creatinine, Ser: 0.6 mg/dL (ref 0.44–1.00)
Glucose, Bld: 160 mg/dL — ABNORMAL HIGH (ref 70–99)
HCT: 33 % — ABNORMAL LOW (ref 36.0–46.0)
Hemoglobin: 11.2 g/dL — ABNORMAL LOW (ref 12.0–15.0)
POTASSIUM: 3.7 mmol/L (ref 3.5–5.1)
Sodium: 139 mmol/L (ref 135–145)
TCO2: 26 mmol/L (ref 22–32)

## 2018-01-17 LAB — I-STAT TROPONIN, ED: TROPONIN I, POC: 0 ng/mL (ref 0.00–0.08)

## 2018-01-17 LAB — I-STAT BETA HCG BLOOD, ED (MC, WL, AP ONLY): I-stat hCG, quantitative: <5 m[IU]/mL (ref <5)

## 2018-01-17 MED ORDER — METHOCARBAMOL 500 MG PO TABS: 500.0000 mg | ORAL_TABLET | Freq: Two times a day (BID) | ORAL | 0 refills | Status: DC

## 2018-01-17 MED ORDER — NAPROXEN 375 MG PO TABS: 375.0000 mg | ORAL_TABLET | Freq: Two times a day (BID) | ORAL | 0 refills | Status: DC

## 2018-01-17 NOTE — Discharge Instructions (Addendum)
Your work-up has been reassuring in the emergency department today.  Unknown cause your symptoms.  Have you follow-up to cardiology. Please take the Naproxen as prescribed for pain. Do not take any additional NSAIDs including Motrin, Aleve, Ibuprofen, Advil. Please the the robaxin for muscle relaxation. This medication will make you drowsy so avoid situation that could place you in danger.

## 2018-01-17 NOTE — ED Provider Notes (Signed)
[facility] DEPT Provider Note   CSN: [id number] Arrival date & time: 01/16/18  2238     History   Chief Complaint Chief Complaint  Patient presents with  . Chest Pain    HPI Andrea Black is a 22 y.o. female.  HPI 22 year old African-American female past medical history significant for polycystic ovary disease presents to the emergency department today for evaluation of right-sided chest pain.  Patient states this has been ongoing for approximately 6 to 7 months.  She states that this evening she was in church and developed right sided chest pain.  States it radiated down her right arm.  Describes it as sharp in nature.  Denies any associated shortness of breath, nausea, emesis, pleuritic chest pain, exertional chest pain.  This lasted for approximately 3 hours and self resolved.  She has a history of same that resolved with ibuprofen.  Was diagnosed with costochondritis.  States the pain is worse with palpation.  Denies any recent injury, cough or fevers.  She did not take anything for pain prior to arrival.  She states the pain is intermittent and happens about 2 times a week.  Denies any palpitations.  Denies any history of PE/DVT, prolonged immobilization, recent hospitalization/surgeries, unilateral leg swelling or calf tenderness, OCP use, hemoptysis.  Reports family cardiac history but she has no cardiac history herself.  Does not follow-up with primary care concerning her symptoms. Denies recent illnesses.  Patient currently denies pain on my examination.  Pt denies any fever, chill, ha, vision changes, lightheadedness, dizziness, congestion, neck pain, cough, abd pain, n/v/d, urinary symptoms, change in bowel habits, melena, hematochezia, lower extremity paresthesias.  Past Medical History:  Diagnosis Date  . Diabetes mellitus without complication ([place])   . Polycystic ovary     Patient Active Problem List   Diagnosis Date Noted  . Exposure to  sexually transmitted disease (STD) 02/15/2017  . Elevated hemoglobin A1c 04/19/2016  . Prolactin increased ([place]) 04/19/2016  . Polycystic ovarian disease 04/14/2016  . Obesity 04/01/2016  . Dysmenorrhea 04/01/2016  . Abnormal granulation tissue of buttock 08/05/2013    Past Surgical History:  Procedure Laterality Date  . WISDOM TOOTH EXTRACTION       OB History    Gravida  0   Para  0   Term  0   Preterm  0   AB  0   Living  0     SAB  0   TAB  0   Ectopic  0   Multiple  0   Live Births               Home Medications    Prior to Admission medications   Medication Sig Start Date End Date Taking? Authorizing Provider  fluconazole (DIFLUCAN) 150 MG tablet Take 1 tablet (150 mg total) by mouth as directed. 150 mg po x 1 and repeat in three days. Patient not taking: Reported on 01/17/2018 02/17/17   Chancy Milroy, MD  ibuprofen (ADVIL,MOTRIN) 800 MG tablet TAKE 1 TABLET BY MOUTH EVERY 8 HOURS AS NEEDED Patient not taking: Reported on 01/17/2018 02/20/17   Shelly Bombard, MD    Family History Family History  Problem Relation Age of Onset  . Colon cancer Mother   . Heart disease Father     Social History Social History   Tobacco Use  . Smoking status: Never Smoker  . Smokeless tobacco: Never Used  Substance Use Topics  . Alcohol use: No  Alcohol/week: 0.0 oz  . Drug use: No     Allergies   Penicillins   Review of Systems Review of Systems  All other systems reviewed and are negative.    Physical Exam Updated Vital Signs BP (!) 125/91   Pulse (!) 55   Temp 98.9 F (37.2 C) (Oral)   Resp 14   LMP 01/09/2018   SpO2 100%   Physical Exam  Constitutional: She is oriented to person, place, and time. She appears well-developed and well-nourished.  Non-toxic appearance. No distress.  HENT:  Head: Normocephalic and atraumatic.  Nose: Nose normal.  Mouth/Throat: Oropharynx is clear and moist.  Eyes: Pupils are equal, round, and  reactive to light. Conjunctivae are normal. Right eye exhibits no discharge. Left eye exhibits no discharge.  Neck: Normal range of motion. Neck supple. No JVD present. No tracheal deviation present.  Cardiovascular: Normal rate, regular rhythm, normal heart sounds and intact distal pulses. Exam reveals no gallop and no friction rub.  No murmur heard. Pulmonary/Chest: Effort normal and breath sounds normal. No respiratory distress. She has no decreased breath sounds. She has no wheezes. She has no rhonchi. She has no rales. She exhibits no tenderness.  No hypoxia or tachypnea.  Abdominal: Soft. Bowel sounds are normal. She exhibits no distension. There is no tenderness. There is no rebound and no guarding.  Musculoskeletal: Normal range of motion.  No lower extremity edema or calf tenderness.  Lymphadenopathy:    She has no cervical adenopathy.  Neurological: She is alert and oriented to person, place, and time.  Skin: Skin is warm and dry. Capillary refill takes less than 2 seconds. She is not diaphoretic.  Psychiatric: Her behavior is normal. Judgment and thought content normal.  Nursing note and vitals reviewed.    ED Treatments / Results  Labs (all labs ordered are listed, but only abnormal results are displayed) Labs Reviewed  I-STAT CHEM 8, ED - Abnormal; Notable for the following components:      Result Value   Glucose, Bld 160 (*)    Hemoglobin 11.2 (*)    HCT 33.0 (*)    All other components within normal limits  I-STAT BETA HCG BLOOD, ED ([facility], [facility], [facility] ONLY)  I-STAT TROPONIN, ED  I-STAT BETA HCG BLOOD, ED ([facility], [facility], [facility] ONLY)    EKG EKG Interpretation  Date/Time:  Tuesday January 16 2018 22:54:35 EDT Ventricular Rate:  53 PR Interval:    QRS Duration: 91 QT Interval:  417 QTC Calculation: 392 R Axis:   76 Text Interpretation:  Sinus rhythm Borderline T abnormalities, anterior leads No old tracing to compare Confirmed by Malvin Johns [phone]) on 01/16/2018 10:57:17  PM   Radiology Dg Chest 2 View  Result Date: 01/16/2018 CLINICAL DATA:  Chest pain. EXAM: CHEST - 2 VIEW COMPARISON:  None. FINDINGS: The heart size and mediastinal contours are within normal limits. Both lungs are clear. No pneumothorax or pleural effusion is noted. The visualized skeletal structures are unremarkable. IMPRESSION: No active cardiopulmonary disease. Electronically Signed   By: Marijo Conception, M.D.   On: 01/16/2018 23:18    Procedures Procedures (including critical care time)  Medications Ordered in ED Medications - No data to display   Initial Impression / Assessment and Plan / ED Course  I have reviewed the triage vital signs and the nursing notes.  Pertinent labs & imaging results that were available during my care of the patient were reviewed by me and considered in my medical decision  making (see chart for details).     Pt presents to the Ed today with complaints of cp. Patient is to be discharged with recommendation to follow up with PCP in regards to today's hospital visit. Chest pain is not likely of cardiac or pulmonary etiology d/t presentation, perc negative, VSS, no tracheal deviation, no JVD or new murmur, RRR, breath sounds equal bilaterally, EKG shows no signs of ischemia, negative troponin, and negative CXR.  Patient has no pain prior to my examination.  Completely resolved on his own.  Troponin was negative.  Lab work reassuring.  Chest x-ray normal.  Clinical presentation not consistent with ACS, dissection or PE.  Etiology of patient's symptoms.  Seems to be chronic in nature.  Pt has been advised to return to the ED is CP becomes exertional, associated with diaphoresis or nausea, radiates to left jaw/arm, worsens or becomes concerning in any way.  Pt is hemodynamically stable, in NAD, & able to ambulate in the ED. Evaluation does not show pathology that would require ongoing emergent intervention or inpatient treatment. I explained the diagnosis to the  patient. Pain has been managed & has no complaints prior to dc. Pt is comfortable with above plan and is stable for discharge at this time. All questions were answered prior to disposition. Strict return precautions for f/u to the ED were discussed. Encouraged follow up with PCP.   Final Clinical Impressions(s) / ED Diagnoses   Final diagnoses:  Nonspecific chest pain    ED Discharge Orders    None       Aaron Edelman 01/17/18 0326    Veryl Speak, MD 01/17/18 [phone]

## 2018-01-17 NOTE — ED Notes (Signed)
ED Provider at bedside. 

## 2018-09-14 ENCOUNTER — Other Ambulatory Visit: Payer: Self-pay

## 2018-09-14 ENCOUNTER — Ambulatory Visit: Payer: Self-pay | Admitting: Medical

## 2018-09-14 ENCOUNTER — Encounter: Payer: Self-pay | Admitting: Medical

## 2018-09-14 ENCOUNTER — Other Ambulatory Visit (HOSPITAL_COMMUNITY)
Admission: RE | Admit: 2018-09-14 | Discharge: 2018-09-14 | Disposition: A | Discharge status: Home / Self Care | Payer: Medicaid Other | Source: Ambulatory Visit | Attending: Medical | Admitting: Medical

## 2018-09-14 VITALS — BP 109/80 | HR 78 | Wt 220.0 lb

## 2018-09-14 DIAGNOSIS — Z01419 Encounter for gynecological examination (general) (routine) without abnormal findings: Secondary | ICD-10-CM | POA: Insufficient documentation

## 2018-09-14 DIAGNOSIS — E282 Polycystic ovarian syndrome: Secondary | ICD-10-CM

## 2018-09-14 DIAGNOSIS — B9689 Other specified bacterial agents as the cause of diseases classified elsewhere: Secondary | ICD-10-CM

## 2018-09-14 DIAGNOSIS — N898 Other specified noninflammatory disorders of vagina: Secondary | ICD-10-CM | POA: Diagnosis present

## 2018-09-14 DIAGNOSIS — N760 Acute vaginitis: Secondary | ICD-10-CM

## 2018-09-14 MED ORDER — TERCONAZOLE 0.8 % VA CREA: TOPICAL_CREAM | VAGINAL | 0 refills | Status: DC

## 2018-09-14 MED ORDER — FLUCONAZOLE 150 MG PO TABS: 150.0000 mg | ORAL_TABLET | Freq: Every day | ORAL | 1 refills | Status: DC

## 2018-09-14 MED ORDER — NORGESTIMATE-ETH ESTRADIOL 0.25-35 MG-MCG PO TABS: 1.0000 | ORAL_TABLET | Freq: Every day | ORAL | 11 refills | Status: DC

## 2018-09-14 NOTE — Patient Instructions (Signed)
Pap Test  Why am I having this test?  A Pap test, also called a Pap smear, is a screening test to check for signs of:  · Cancer of the vagina, cervix, and uterus. The cervix is the lower part of the uterus that opens into the vagina.  · Infection.  · Changes that may be a sign that cancer is developing (precancerous changes).  Women need this test on a regular basis. In general, you should have a Pap test every 3 years until you reach menopause or age 22. Women aged 30-60 may choose to have their Pap test done at the same time as an HPV (human papillomavirus) test every 5 years (instead of every 3 years).  Your health care provider may recommend having Pap tests more or less often depending on your medical conditions and past Pap test results.  What kind of sample is taken?    Your health care provider will collect a sample of cells from the surface of your cervix. This will be done using a small cotton swab, plastic spatula, or brush. This sample is often collected during a pelvic exam, when you are lying on your back on an exam table with feet in footrests (stirrups).  In some cases, fluids (secretions) from the cervix or vagina may also be collected.  How do I prepare for this test?  · Be aware of where you are in your menstrual cycle. If you are menstruating on the day of the test, you may be asked to reschedule.  · You may need to reschedule if you have a known vaginal infection on the day of the test.  · Follow instructions from your health care provider about:  ? Changing or stopping your regular medicines. Some medicines can cause abnormal test results, such as digitalis and tetracycline.  ? Avoiding douching or taking a bath the day before or the day of the test.  Tell a health care provider about:  · Any allergies you have.  · All medicines you are taking, including vitamins, herbs, eye drops, creams, and over-the-counter medicines.  · Any blood disorders you have.  · Any surgeries you have had.  · Any  medical conditions you have.  · Whether you are pregnant or may be pregnant.  How are the results reported?  Your test results will be reported as either abnormal or normal.  A false-positive result can occur. A false positive is incorrect because it means that a condition is present when it is not.  A false-negative result can occur. A false negative is incorrect because it means that a condition is not present when it is.  What do the results mean?  A normal test result means that you do not have signs of cancer of the vagina, cervix, or uterus.  An abnormal result may mean that you have:  · Cancer. A Pap test by itself is not enough to diagnose cancer. You will have more tests done in this case.  · Precancerous changes in your vagina, cervix, or uterus.  · Inflammation of the cervix.  · An STD (sexually transmitted disease).  · A fungal infection.  · A parasite infection.  Talk with your health care provider about what your results mean.  Questions to ask your health care provider  Ask your health care provider, or the department that is doing the test:  · When will my results be ready?  · How will I get my results?  · What are my   treatment options?  · What other tests do I need?  · What are my next steps?  Summary  · In general, women should have a Pap test every 3 years until they reach menopause or age 22.  · Your health care provider will collect a sample of cells from the surface of your cervix. This will be done using a small cotton swab, plastic spatula, or brush.  · In some cases, fluids (secretions) from the cervix or vagina may also be collected.  This information is not intended to replace advice given to you by your health care provider. Make sure you discuss any questions you have with your health care provider.  Document Released: 09/03/2002 Document Revised: 02/20/2017 Document Reviewed: 02/20/2017  Elsevier Interactive Patient Education © 2019 [employer]

## 2018-09-14 NOTE — Progress Notes (Signed)
  History:  [name] is a 23 y.o. G0P0000 who presents to clinic today for evaluation of thick, white discharge that comes and goes for many weeks. She states it is also associated with occasional itching and redness. She has tried monistat without relief.   She also would like to discuss treating her PCOS symptoms. She is interested in pregnancy, but not for at least 6 months. She states that her periods have stayed regular, lasting 5 days per month with heavy bleeding. She has had more pain with her periods. She uses Aleve which helps some. She has tried Metformin in the past without relief and did not like the side effects. LMP 08/31/18. She has been sexually active in the last 2 weeks with condoms.   Patient has not had a pap before.   The following portions of the patient's history were reviewed and updated as appropriate: allergies, current medications, family history, past medical history, social history, past surgical history and problem list.  Review of Systems:  Review of Systems  Constitutional: Negative for fever.  Gastrointestinal: Negative for abdominal pain.  Genitourinary: Negative for dysuria, frequency and urgency.       + vaginal discharge, irritation Neg - vaginal bleeding      Objective:  Physical Exam BP 109/80   Pulse 78   Wt 220 lb (99.8 kg)   LMP 08/28/2018   BMI 40.90 kg/m  Physical Exam  Nursing note and vitals reviewed. Constitutional: She is oriented to person, place, and time. She appears well-developed and well-nourished. No distress.  HENT:  Head: Normocephalic and atraumatic.  Cardiovascular: Normal rate.  Respiratory: Effort normal.  GI: Soft. She exhibits no distension. There is no abdominal tenderness.  Genitourinary: There is rash (significant erythema noted of the bilateral labia majora and minora somewhat obscured by vaginal cream applied today) on the right labia. There is rash on the left labia. Cervix exhibits no discharge and no  friability.    Vaginal discharge (moderate thick white discharge) and erythema present.     No vaginal bleeding.  There is erythema in the vagina. No bleeding in the vagina.  Neurological: She is alert and oriented to person, place, and time.  Skin: Skin is warm and dry. No erythema.  Psychiatric: She has a normal mood and affect.    Assessment & Plan:  1. Vaginal discharge - Cervicovaginal ancillary only( [place]) - HIV antibody (with reflex) - RPR - Hepatitis B surface antigen - Hepatitis C antibody - Rx sent for Diflucan x 2 doses q 3 days and Terazol cream to be applied topically QHS  2. PCOS (polycystic ovarian syndrome) - HgB A1c - norgestimate-ethinyl estradiol (ORTHO-CYCLEN,SPRINTEC,PREVIFEM) 0.25-35 MG-MCG tablet; Take 1 tablet by mouth daily.  Dispense: 1 Package; Refill: 11 - Patient will trial OCPs x 6 months and then return to office to discuss continuing for birth control and bleeding regulation or discontinuing to attempt to conceive  3. Pap smear, as part of routine gynecological examination - Cytology - PAP( [facility])  Follow-up in 6 months or sooner if symptoms change or worsen  Danielle Rankin 09/14/2018 12:33 PM

## 2018-09-15 LAB — HEPATITIS C ANTIBODY: Hep C Virus Ab: <0.1 s/co ratio (ref 0.0–0.9)

## 2018-09-15 LAB — RPR: RPR: NONREACTIVE

## 2018-09-15 LAB — HIV ANTIBODY (ROUTINE TESTING W REFLEX): HIV Screen 4th Generation wRfx: NONREACTIVE

## 2018-09-15 LAB — HEPATITIS B SURFACE ANTIGEN: Hepatitis B Surface Ag: NEGATIVE

## 2018-09-15 LAB — HEMOGLOBIN A1C
ESTIMATED AVERAGE GLUCOSE: 278 mg/dL
Hgb A1c MFr Bld: 11.3 % — ABNORMAL HIGH (ref 4.8–5.6)

## 2018-09-18 LAB — CYTOLOGY - PAP: DIAGNOSIS: NEGATIVE

## 2018-09-18 LAB — CERVICOVAGINAL ANCILLARY ONLY
Bacterial vaginitis: POSITIVE — AB
CHLAMYDIA, DNA PROBE: NEGATIVE
Candida vaginitis: POSITIVE — AB
Neisseria Gonorrhea: NEGATIVE
Trichomonas: NEGATIVE

## 2018-09-19 MED ORDER — METRONIDAZOLE 500 MG PO TABS: 500.0000 mg | ORAL_TABLET | Freq: Two times a day (BID) | ORAL | 0 refills | Status: DC

## 2018-09-19 NOTE — Addendum Note (Signed)
Addended by: Luvenia Redden on: 09/19/2018 09:38 AM   Modules accepted: Orders

## 2018-10-09 ENCOUNTER — Ambulatory Visit: Payer: Self-pay | Admitting: Obstetrics and Gynecology

## 2018-11-05 ENCOUNTER — Telehealth: Payer: Self-pay | Admitting: *Deleted

## 2018-11-05 NOTE — Telephone Encounter (Signed)
Pt called to office, LM stating she had concerns with her BC and vaginal bumps/skin peeling.  Attempt to return call. No answer, LM on VM to call back.

## 2018-11-12 ENCOUNTER — Ambulatory Visit: Payer: Medicaid Other | Admitting: Advanced Practice Midwife

## 2018-11-26 ENCOUNTER — Telehealth: Payer: Self-pay

## 2018-11-26 NOTE — Telephone Encounter (Signed)
Patient left message regarding appt request Birth Control issues. Pt info given to scheduling to make webex appt.

## 2018-11-28 ENCOUNTER — Other Ambulatory Visit: Payer: Self-pay

## 2018-11-28 ENCOUNTER — Ambulatory Visit: Payer: Medicaid Other

## 2018-12-30 ENCOUNTER — Other Ambulatory Visit: Payer: Self-pay

## 2018-12-30 ENCOUNTER — Encounter (HOSPITAL_COMMUNITY): Payer: Self-pay

## 2018-12-30 ENCOUNTER — Ambulatory Visit (HOSPITAL_COMMUNITY)
Admission: EM | Admit: 2018-12-30 | Discharge: 2018-12-30 | Disposition: A | Discharge status: Home / Self Care | Payer: Medicaid Other | Attending: Family Medicine | Admitting: Family Medicine

## 2018-12-30 DIAGNOSIS — A6004 Herpesviral vulvovaginitis: Secondary | ICD-10-CM | POA: Insufficient documentation

## 2018-12-30 MED ORDER — HYDROCODONE-ACETAMINOPHEN 5-325 MG PO TABS: 1.0000 | ORAL_TABLET | Freq: Four times a day (QID) | ORAL | 0 refills | Status: DC | PRN

## 2018-12-30 MED ORDER — VALACYCLOVIR HCL 1 G PO TABS: 1000.0000 mg | ORAL_TABLET | Freq: Two times a day (BID) | ORAL | 0 refills | Status: DC

## 2018-12-30 MED ORDER — LIDOCAINE VISCOUS HCL 2 % MT SOLN
15.0000 mL | Freq: Once | OROMUCOSAL | Status: AC
  Administered 2018-12-30: 15 mL via OROMUCOSAL

## 2018-12-30 MED ORDER — HYDROCODONE-ACETAMINOPHEN 5-325 MG PO TABS
ORAL_TABLET | ORAL | Status: AC
  Filled 2018-12-30: qty 1

## 2018-12-30 MED ORDER — LIDOCAINE VISCOUS HCL 2 % MT SOLN
OROMUCOSAL | Status: AC
  Filled 2018-12-30: qty 15

## 2018-12-30 MED ORDER — HYDROCODONE-ACETAMINOPHEN 5-325 MG PO TABS
1.0000 | ORAL_TABLET | Freq: Once | ORAL | Status: AC
Start: 2018-12-30 — End: 2018-12-30
  Administered 2018-12-30: 1 via ORAL

## 2018-12-30 MED ORDER — LIDOCAINE 5 % EX OINT: 1.0000 | TOPICAL_OINTMENT | Freq: Four times a day (QID) | CUTANEOUS | 0 refills | Status: DC | PRN

## 2018-12-30 NOTE — ED Provider Notes (Signed)
[facility]    CSN: [id number] Arrival date & time: 12/30/18  1118      History   Chief Complaint Chief Complaint  Patient presents with  . Groin Swelling    HPI Andrea Black is a 23 y.o. female.   HPI  Patient is here for very painful rash to her external genitals.  Swelling of the external genitals.  Pain with moving and sitting.  Pain with urination.  Swollen glands.  Low-grade fever.  Malaise.  Has been married since February.  States she is never had a similar rash or herpes.  Husband also has a rash on his penis.  They both deny any prior STD  Past Medical History:  Diagnosis Date  . Diabetes mellitus without complication ([place])   . Polycystic ovary     Patient Active Problem List   Diagnosis Date Noted  . Exposure to sexually transmitted disease (STD) 02/15/2017  . Elevated hemoglobin A1c 04/19/2016  . Prolactin increased ([place]) 04/19/2016  . Polycystic ovarian disease 04/14/2016  . Obesity 04/01/2016  . Dysmenorrhea 04/01/2016  . Abnormal granulation tissue of buttock 08/05/2013    Past Surgical History:  Procedure Laterality Date  . WISDOM TOOTH EXTRACTION      OB History    Gravida  0   Para  0   Term  0   Preterm  0   AB  0   Living  0     SAB  0   TAB  0   Ectopic  0   Multiple  0   Live Births               Home Medications    Prior to Admission medications   Medication Sig Start Date End Date Taking? Authorizing Provider  HYDROcodone-acetaminophen (NORCO/VICODIN) 5-325 MG tablet Take 1-2 tablets by mouth every 6 (six) hours as needed. 12/30/18   Raylene Everts, MD  lidocaine (XYLOCAINE) 5 % ointment Apply 1 application topically 4 (four) times daily as needed. 12/30/18   Raylene Everts, MD  norgestimate-ethinyl estradiol (ORTHO-CYCLEN,SPRINTEC,PREVIFEM) 0.25-35 MG-MCG tablet Take 1 tablet by mouth daily. 09/14/18   Luvenia Redden, PA-C  valACYclovir (VALTREX) 1000 MG tablet Take 1 tablet (1,000 mg total)  by mouth 2 (two) times daily. 12/30/18   Raylene Everts, MD    Family History Family History  Problem Relation Age of Onset  . Colon cancer Mother   . Heart disease Father     Social History Social History   Tobacco Use  . Smoking status: Never Smoker  . Smokeless tobacco: Never Used  Substance Use Topics  . Alcohol use: No    Alcohol/week: 0.0 standard drinks  . Drug use: No     Allergies   Penicillins   Review of Systems Review of Systems  Constitutional: Positive for fever. Negative for chills.  HENT: Negative for ear pain and sore throat.   Eyes: Negative for pain and visual disturbance.  Respiratory: Negative for cough and shortness of breath.   Cardiovascular: Negative for chest pain and palpitations.  Gastrointestinal: Negative for abdominal pain and vomiting.  Genitourinary: Positive for decreased urine volume, difficulty urinating, genital sores, vaginal discharge and vaginal pain. Negative for dysuria and hematuria.  Musculoskeletal: Negative for arthralgias and back pain.  Skin: Negative for color change and rash.  Neurological: Negative for seizures and syncope.  All other systems reviewed and are negative.    Physical Exam Triage Vital Signs ED Triage Vitals  Enc Vitals Group     BP 12/30/18 1134 115/78     Pulse Rate 12/30/18 1134 91     Resp 12/30/18 1134 16     Temp 12/30/18 1134 99.4 F (37.4 C)     Temp Source 12/30/18 1134 Oral     SpO2 12/30/18 1134 100 %     Weight --      Height --      Head Circumference --      Peak Flow --      Pain Score 12/30/18 1132 10     Pain Loc --      Pain Edu? --      Excl. in [place]? --    No data found.  Updated Vital Signs BP 115/78 (BP Location: Left Arm)   Pulse 91   Temp 99.4 F (37.4 C) (Oral)   Resp 16   SpO2 100%        Physical Exam Constitutional:      General: She is not in acute distress.    Appearance: She is well-developed.  HENT:     Head: Normocephalic and atraumatic.   Eyes:     Conjunctiva/sclera: Conjunctivae normal.     Pupils: Pupils are equal, round, and reactive to light.  Neck:     Musculoskeletal: Normal range of motion.  Cardiovascular:     Rate and Rhythm: Normal rate.  Pulmonary:     Effort: Pulmonary effort is normal. No respiratory distress.  Abdominal:     General: There is no distension.     Palpations: Abdomen is soft.  Genitourinary:    Exam position: Supine.     Labia:        Right: Rash, tenderness and lesion present.        Left: Rash, tenderness and lesion present.     Musculoskeletal: Normal range of motion.  Skin:    General: Skin is warm and dry.  Neurological:     General: No focal deficit present.     Mental Status: She is alert.  Psychiatric:     Comments: tearful      UC Treatments / Results  Labs (all labs ordered are listed, but only abnormal results are displayed) Labs Reviewed  HSV CULTURE AND TYPING    EKG   Radiology No results found.  Procedures Procedures (including critical care time)  Medications Ordered in UC Medications  HYDROcodone-acetaminophen (NORCO/VICODIN) 5-325 MG per tablet 1 tablet (1 tablet Oral Given 12/30/18 1204)  lidocaine (XYLOCAINE) 2 % viscous mouth solution 15 mL (15 mLs Mouth/Throat Given 12/30/18 1204)  HYDROcodone-acetaminophen (NORCO/VICODIN) 5-325 MG per tablet (has no administration in time range)  lidocaine (XYLOCAINE) 2 % viscous mouth solution (has no administration in time range)    Initial Impression / Assessment and Plan / UC Course  I have reviewed the triage vital signs and the nursing notes.  Pertinent labs & imaging results that were available during my care of the patient were reviewed by me and considered in my medical decision making (see chart for details).     Explained that there is very painful rash and inability urinate with low-grade fevers typical for the primary herpes infection.  Her husband denies ever having an infection before either.   It is likely that he has since they endorse monogamy. Final Clinical Impressions(s) / UC Diagnoses   Final diagnoses:  Primary vulvovaginal herpes simplex infection     Discharge Instructions     Take the valacyclovir antiviral medicine  3 times a day for 10 days Take 2 doses today Take the pain medication as needed.  This can cause drowsiness.  Do not drive on pain medicine.  It can cause constipation.  Please take a stool softener while on the pain medicine Use the ointment as needed.  Use prior to bathing and using the bathroom. If you have difficulty emptying your bladder, filled the bathtub with lukewarm water and urinate in the water Call for any problems   ED Prescriptions    Medication Sig Dispense Auth. Provider   valACYclovir (VALTREX) 1000 MG tablet Take 1 tablet (1,000 mg total) by mouth 2 (two) times daily. 20 tablet Raylene Everts, MD   HYDROcodone-acetaminophen (NORCO/VICODIN) 5-325 MG tablet Take 1-2 tablets by mouth every 6 (six) hours as needed. 20 tablet Raylene Everts, MD   lidocaine (XYLOCAINE) 5 % ointment Apply 1 application topically 4 (four) times daily as needed. 50 g Raylene Everts, MD     Controlled Substance Prescriptions [place] Controlled Substance Registry consulted? Not Applicable   Raylene Everts, MD 12/30/18 [phone]

## 2018-12-30 NOTE — Discharge Instructions (Addendum)
Take the valacyclovir antiviral medicine 3 times a day for 10 days Take 2 doses today Take the pain medication as needed.  This can cause drowsiness.  Do not drive on pain medicine.  It can cause constipation.  Please take a stool softener while on the pain medicine Use the ointment as needed.  Use prior to bathing and using the bathroom. If you have difficulty emptying your bladder, filled the bathtub with lukewarm water and urinate in the water Call for any problems

## 2018-12-30 NOTE — ED Triage Notes (Signed)
Patient presents to Urgent Care with complaints of vaginal swelling and odd discharge/peeling since a few days ago. Patient reports it is very painful, particularly with urination and ambulation.

## 2019-01-01 ENCOUNTER — Ambulatory Visit (HOSPITAL_COMMUNITY)
Admission: EM | Admit: 2019-01-01 | Discharge: 2019-01-01 | Disposition: A | Discharge status: Home / Self Care | Payer: Medicaid Other | Attending: Emergency Medicine | Admitting: Emergency Medicine

## 2019-01-01 ENCOUNTER — Encounter (HOSPITAL_COMMUNITY): Payer: Self-pay

## 2019-01-01 ENCOUNTER — Other Ambulatory Visit: Payer: Self-pay

## 2019-01-01 DIAGNOSIS — A6004 Herpesviral vulvovaginitis: Secondary | ICD-10-CM | POA: Insufficient documentation

## 2019-01-01 LAB — POCT URINALYSIS DIP (DEVICE)
Bilirubin Urine: NEGATIVE
Glucose, UA: 500 mg/dL — AB
Ketones, ur: 80 mg/dL — AB
Nitrite: NEGATIVE
Protein, ur: 100 mg/dL — AB
Specific Gravity, Urine: >=1.03 (ref 1.005–1.030)
Urobilinogen, UA: 0.2 mg/dL (ref 0.0–1.0)
pH: 6 (ref 5.0–8.0)

## 2019-01-01 MED ORDER — LIDOCAINE VISCOUS HCL 2 % MT SOLN
15.0000 mL | Freq: Once | OROMUCOSAL | Status: AC
  Administered 2019-01-01: 15 mL via OROMUCOSAL

## 2019-01-01 MED ORDER — LIDOCAINE VISCOUS HCL 2 % MT SOLN: OROMUCOSAL | 0 refills | Status: DC

## 2019-01-01 MED ORDER — DOXYCYCLINE HYCLATE 100 MG PO CAPS: 100.0000 mg | ORAL_CAPSULE | Freq: Two times a day (BID) | ORAL | 0 refills | Status: AC

## 2019-01-01 NOTE — ED Provider Notes (Signed)
[facility]    CSN: [id number] Arrival date & time: 01/01/19  1334      History   Chief Complaint Chief Complaint  Patient presents with  . Groin Swelling    HPI Andrea Black is a 23 y.o. female history of DM type II, PCOS, presenting today for evaluation of dental pain and swelling.  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  She has started Valtrex which have helped with her outbreak, but also notes that she was using lidocaine 5% and after started using this to help with pain developed increased pain swelling and burning.  She has had a lot of discomfort with urination.  She has been using hydrocodone sparingly.  Has 5 tablets left from previous prescription.  States that she used 2% in clinic without issue, but believes the 5% is what has exacerbated her pain.  HPI  Past Medical History:  Diagnosis Date  . Diabetes mellitus without complication ([place])   . Polycystic ovary     Patient Active Problem List   Diagnosis Date Noted  . Exposure to sexually transmitted disease (STD) 02/15/2017  . Elevated hemoglobin A1c 04/19/2016  . Prolactin increased ([place]) 04/19/2016  . Polycystic ovarian disease 04/14/2016  . Obesity 04/01/2016  . Dysmenorrhea 04/01/2016  . Abnormal granulation tissue of buttock 08/05/2013    Past Surgical History:  Procedure Laterality Date  . WISDOM TOOTH EXTRACTION      OB History    Gravida  0   Para  0   Term  0   Preterm  0   AB  0   Living  0     SAB  0   TAB  0   Ectopic  0   Multiple  0   Live Births               Home Medications    Prior to Admission medications   Medication Sig Start Date End Date Taking? Authorizing Provider  doxycycline (VIBRAMYCIN) 100 MG capsule Take 1 capsule (100 mg total) by mouth 2 (two) times daily for 10 days. 01/01/19 01/11/19  Heidee Audi C, PA-C  HYDROcodone-acetaminophen (NORCO/VICODIN) 5-325 MG tablet Take 1-2 tablets  by mouth every 6 (six) hours as needed. 12/30/18   Raylene Everts, MD  lidocaine (XYLOCAINE) 2 % solution Apply topically 4 times daily 01/01/19   Odilia Damico, [place] C, PA-C  norgestimate-ethinyl estradiol (ORTHO-CYCLEN,SPRINTEC,PREVIFEM) 0.25-35 MG-MCG tablet Take 1 tablet by mouth daily. 09/14/18   Luvenia Redden, PA-C  valACYclovir (VALTREX) 1000 MG tablet Take 1 tablet (1,000 mg total) by mouth 2 (two) times daily. 12/30/18   Raylene Everts, MD    Family History Family History  Problem Relation Age of Onset  . Colon cancer Mother   . Heart disease Father     Social History Social History   Tobacco Use  . Smoking status: Never Smoker  . Smokeless tobacco: Never Used  Substance Use Topics  . Alcohol use: No    Alcohol/week: 0.0 standard drinks  . Drug use: No     Allergies   Penicillins   Review of Systems Review of Systems  Constitutional: Negative for fever.  Respiratory: Negative for shortness of breath.   Cardiovascular: Negative for chest pain.  Gastrointestinal: Negative for abdominal pain, diarrhea, nausea and vomiting.  Genitourinary: Positive for dysuria, genital sores and pelvic pain. Negative for flank pain, hematuria, menstrual problem, vaginal bleeding, vaginal discharge and vaginal pain.  Musculoskeletal: Negative for back pain.  Skin: Negative for rash.  Neurological: Negative for dizziness, light-headedness and headaches.     Physical Exam Triage Vital Signs ED Triage Vitals [01/01/19 1414]  Enc Vitals Group     BP      Pulse      Resp      Temp      Temp src      SpO2      Weight 209 lb (94.8 kg)     Height      Head Circumference      Peak Flow      Pain Score 7     Pain Loc      Pain Edu?      Excl. in [place]?    No data found.  Updated Vital Signs Wt 209 lb (94.8 kg)   LMP 12/26/2018   BMI 38.85 kg/m   Visual Acuity Right Eye Distance:   Left Eye Distance:   Bilateral Distance:    Right Eye Near:   Left Eye Near:     Bilateral Near:     Physical Exam Vitals signs and nursing note reviewed.  Constitutional:      General: She is not in acute distress.    Appearance: She is well-developed.  HENT:     Head: Normocephalic and atraumatic.  Eyes:     Conjunctiva/sclera: Conjunctivae normal.  Neck:     Musculoskeletal: Neck supple.  Cardiovascular:     Rate and Rhythm: Normal rate and regular rhythm.     Heart sounds: No murmur.  Pulmonary:     Effort: Pulmonary effort is normal. No respiratory distress.     Breath sounds: Normal breath sounds.  Abdominal:     Palpations: Abdomen is soft.     Tenderness: There is no abdominal tenderness.  Genitourinary:    Comments: Labia majora and vulvar area with multiple herpetic-like ulcers that extend to the perineal area and near her rectum, labia majora minora significantly erythematous and swollen and tender, appears to have moderate amount of yellowish pustular drainage Skin:    General: Skin is warm and dry.  Neurological:     Mental Status: She is alert.      UC Treatments / Results  Labs (all labs ordered are listed, but only abnormal results are displayed) Labs Reviewed  POCT URINALYSIS DIP (DEVICE) - Abnormal; Notable for the following components:      Result Value   Glucose, UA 500 (*)    Ketones, ur 80 (*)    Hgb urine dipstick SMALL (*)    Protein, ur 100 (*)    Leukocytes,Ua TRACE (*)    All other components within normal limits  URINE CULTURE    EKG   Radiology No results found.  Procedures Procedures (including critical care time)  Medications Ordered in UC Medications  lidocaine (XYLOCAINE) 2 % viscous mouth solution 15 mL (15 mLs Mouth/Throat Given by Other 01/01/19 1451)    Initial Impression / Assessment and Plan / UC Course  I have reviewed the triage vital signs and the nursing notes.  Pertinent labs & imaging results that were available during my care of the patient were reviewed by me and considered in my medical  decision making (see chart for details).     Had relief of pain with topical application of viscous lidocaine 2%, was able to void and empty her bladder.  Will send urine off for culture.  Initiating on antibiotics as well to cover  for any secondary infection contributing to pain swelling and discharge although discharge likely from herpes ulcers.  Initially going to prescribe Augmentin/Keflex, but has reported anaphylaxis to penicillins.  Will try doxycycline as alternative.  Viscous Lidocaine prescribed as she had relief with this in clinic to use topically.  Continue Valtrex as prescribed until lesions resolved.  Follow-up with PCP for suppression therapy.  Please follow-up if symptoms not resolving, worsening, developing fevers or unable to void greater than 8 hours.Discussed strict return precautions. Patient verbalized understanding and is agreeable with plan.  Final Clinical Impressions(s) / UC Diagnoses   Final diagnoses:  Primary vulvovaginal herpes simplex infection     Discharge Instructions     Continue Valtrex as prescribed until lesions cleared Begin doxycyline twice daily for 1 week Warm soaks/ sitz bath Hydrocodone for severe pain Viscous lidocaine prescribed, use as needed    ED Prescriptions    Medication Sig Dispense Auth. Provider   lidocaine (XYLOCAINE) 2 % solution Apply topically 4 times daily 200 mL Myer Bohlman C, PA-C   doxycycline (VIBRAMYCIN) 100 MG capsule Take 1 capsule (100 mg total) by mouth 2 (two) times daily for 10 days. 20 capsule Brit Wernette C, PA-C     Controlled Substance Prescriptions [place] Controlled Substance Registry consulted? Not Applicable   Janith Lima, [place] 01/01/19 1525

## 2019-01-01 NOTE — Discharge Instructions (Signed)
Continue Valtrex as prescribed until lesions cleared Begin doxycyline twice daily for 1 week Warm soaks/ sitz bath Hydrocodone for severe pain Viscous lidocaine prescribed, use as needed

## 2019-01-01 NOTE — ED Triage Notes (Signed)
Pt states she has vaginal swelling and the meds that were given did not work.

## 2019-01-01 NOTE — ED Notes (Signed)
Pt sts she wants viscous lidocaine on her vagina

## 2019-01-02 LAB — HSV CULTURE AND TYPING

## 2019-01-02 LAB — URINE CULTURE: Culture: >=100000 — AB

## 2019-01-03 ENCOUNTER — Telehealth (HOSPITAL_COMMUNITY): Payer: Self-pay | Admitting: Emergency Medicine

## 2019-01-03 NOTE — Telephone Encounter (Signed)
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 Contacted pt to let her know, pt states neither her or her husband have had cold sores before due to showing HSV 1. Pt made aware of possible antibody testing if needed. All questions answered.

## 2019-01-06 ENCOUNTER — Ambulatory Visit (HOSPITAL_COMMUNITY)
Admission: EM | Admit: 2019-01-06 | Discharge: 2019-01-06 | Disposition: A | Discharge status: Home / Self Care | Payer: Medicaid Other

## 2019-01-06 ENCOUNTER — Other Ambulatory Visit: Payer: Self-pay

## 2019-01-06 ENCOUNTER — Telehealth (HOSPITAL_COMMUNITY): Payer: Self-pay | Admitting: Emergency Medicine

## 2019-01-06 MED ORDER — VALACYCLOVIR HCL 1 G PO TABS: 1000.0000 mg | ORAL_TABLET | Freq: Two times a day (BID) | ORAL | 0 refills | Status: DC

## 2019-01-06 NOTE — Telephone Encounter (Signed)
Provided refill of valtrex as still having genital lesions, advised to follow up with OBGYN.

## 2019-01-06 NOTE — ED Triage Notes (Signed)
Pt and spouse present for antibody testing following a positive HSV1 result; both state they are puzzled due to having a genital outbreak and were told they could get antibody testing.  Concha [place], PA discussing with pt & spouse.

## 2019-01-18 ENCOUNTER — Encounter (HOSPITAL_COMMUNITY): Payer: Self-pay | Admitting: Emergency Medicine

## 2019-01-18 ENCOUNTER — Ambulatory Visit (HOSPITAL_COMMUNITY)
Admission: EM | Admit: 2019-01-18 | Discharge: 2019-01-18 | Disposition: A | Discharge status: Home / Self Care | Payer: Self-pay | Attending: Family Medicine | Admitting: Family Medicine

## 2019-01-18 ENCOUNTER — Other Ambulatory Visit: Payer: Self-pay

## 2019-01-18 DIAGNOSIS — A6000 Herpesviral infection of urogenital system, unspecified: Secondary | ICD-10-CM

## 2019-01-18 MED ORDER — VALACYCLOVIR HCL 1 G PO TABS: ORAL_TABLET | ORAL | 2 refills | Status: DC

## 2019-01-18 NOTE — Discharge Instructions (Signed)
See GYN in follow up

## 2019-01-18 NOTE — ED Provider Notes (Signed)
[facility]    CSN: [id number] Arrival date & time: 01/18/19  1809     History   Chief Complaint Chief Complaint  Patient presents with  . Medication Refill    HPI Andrea Black is a 23 y.o. female.   HPI  Patient is here requesting a refill on her Valtrex.  Her genital herpes is healed.  Her husband was put on a 90-day once a day suppression.  She would like to take the same. She would like to discuss pregnancy.  I told her she needs to get in touch with a OB/GYN, have an exam, discussed with them  Past Medical History:  Diagnosis Date  . Diabetes mellitus without complication ([place])   . Polycystic ovary     Patient Active Problem List   Diagnosis Date Noted  . Exposure to sexually transmitted disease (STD) 02/15/2017  . Elevated hemoglobin A1c 04/19/2016  . Prolactin increased ([place]) 04/19/2016  . Polycystic ovarian disease 04/14/2016  . Obesity 04/01/2016  . Dysmenorrhea 04/01/2016  . Abnormal granulation tissue of buttock 08/05/2013    Past Surgical History:  Procedure Laterality Date  . WISDOM TOOTH EXTRACTION      OB History    Gravida  0   Para  0   Term  0   Preterm  0   AB  0   Living  0     SAB  0   TAB  0   Ectopic  0   Multiple  0   Live Births               Home Medications    Prior to Admission medications   Medication Sig Start Date End Date Taking? Authorizing Provider  HYDROcodone-acetaminophen (NORCO/VICODIN) 5-325 MG tablet Take 1-2 tablets by mouth every 6 (six) hours as needed. 12/30/18   Raylene Everts, MD  lidocaine (XYLOCAINE) 2 % solution Apply topically 4 times daily 01/01/19   Wieters, [place] C, PA-C  norgestimate-ethinyl estradiol (ORTHO-CYCLEN,SPRINTEC,PREVIFEM) 0.25-35 MG-MCG tablet Take 1 tablet by mouth daily. 09/14/18   Luvenia Redden, PA-C  valACYclovir (VALTREX) 1000 MG tablet Take TID for one week, then one daily until gone 01/18/19   Raylene Everts, MD    Family History Family  History  Problem Relation Age of Onset  . Colon cancer Mother   . Heart disease Father     Social History Social History   Tobacco Use  . Smoking status: Never Smoker  . Smokeless tobacco: Never Used  Substance Use Topics  . Alcohol use: No    Alcohol/week: 0.0 standard drinks  . Drug use: No     Allergies   Penicillins   Review of Systems Review of Systems  Constitutional: Negative for chills and fever.  HENT: Negative for ear pain and sore throat.   Eyes: Negative for pain and visual disturbance.  Respiratory: Negative for cough and shortness of breath.   Cardiovascular: Negative for chest pain and palpitations.  Gastrointestinal: Negative for abdominal pain and vomiting.  Genitourinary: Negative for dysuria and hematuria.  Musculoskeletal: Negative for arthralgias and back pain.  Skin: Positive for rash. Negative for color change.  Neurological: Negative for seizures and syncope.  All other systems reviewed and are negative.    Physical Exam Triage Vital Signs ED Triage Vitals  Enc Vitals Group     BP 01/18/19 1826 120/82     Pulse Rate 01/18/19 1826 82     Resp 01/18/19 1826 18  Temp 01/18/19 1826 98.3 F (36.8 C)     Temp src --      SpO2 01/18/19 1826 100 %     Weight --      Height --      Head Circumference --      Peak Flow --      Pain Score 01/18/19 1827 0     Pain Loc --      Pain Edu? --      Excl. in [place]? --    No data found.  Updated Vital Signs BP 120/82   Pulse 82   Temp 98.3 F (36.8 C)   Resp 18   LMP 12/26/2018   SpO2 100%   Visual Acuity Right Eye Distance:   Left Eye Distance:   Bilateral Distance:    Right Eye Near:   Left Eye Near:    Bilateral Near:     Physical Exam Constitutional:      General: She is not in acute distress.    Appearance: She is well-developed.  HENT:     Head: Normocephalic and atraumatic.  Eyes:     Conjunctiva/sclera: Conjunctivae normal.     Pupils: Pupils are equal, round, and  reactive to light.  Neck:     Musculoskeletal: Normal range of motion.  Cardiovascular:     Rate and Rhythm: Normal rate.  Pulmonary:     Effort: Pulmonary effort is normal. No respiratory distress.  Abdominal:     General: There is no distension.     Palpations: Abdomen is soft.  Musculoskeletal: Normal range of motion.  Skin:    General: Skin is warm and dry.  Neurological:     Mental Status: She is alert.      UC Treatments / Results  Labs (all labs ordered are listed, but only abnormal results are displayed) Labs Reviewed - No data to display  EKG   Radiology No results found.  Procedures Procedures (including critical care time)  Medications Ordered in UC Medications - No data to display  Initial Impression / Assessment and Plan / UC Course  I have reviewed the triage vital signs and the nursing notes.  Pertinent labs & imaging results that were available during my care of the patient were reviewed by me and considered in my medical decision making (see chart for details).     Med refill.  Put on suppressive treatment. Final Clinical Impressions(s) / UC Diagnoses   Final diagnoses:  Recurrent genital herpes     Discharge Instructions     See GYN in follow up    ED Prescriptions    Medication Sig Dispense Auth. Provider   valACYclovir (VALTREX) 1000 MG tablet Take TID for one week, then one daily until gone 100 tablet Meda Coffee Jennette Banker, MD     Controlled Substance Prescriptions [place] Controlled Substance Registry consulted? Not Applicable   Raylene Everts, MD 01/18/19 2001

## 2019-01-18 NOTE — ED Triage Notes (Signed)
Pt here for a refill on her valtrex, states she sores are healing but not completely gone.

## 2019-06-18 ENCOUNTER — Ambulatory Visit (INDEPENDENT_AMBULATORY_CARE_PROVIDER_SITE_OTHER): Payer: Medicaid Other | Admitting: Obstetrics and Gynecology

## 2019-06-18 ENCOUNTER — Encounter: Payer: Self-pay | Admitting: Obstetrics and Gynecology

## 2019-06-18 ENCOUNTER — Other Ambulatory Visit: Payer: Self-pay

## 2019-06-18 ENCOUNTER — Other Ambulatory Visit (HOSPITAL_COMMUNITY)
Admission: RE | Admit: 2019-06-18 | Discharge: 2019-06-18 | Disposition: A | Discharge status: Home / Self Care | Payer: Medicaid Other | Source: Ambulatory Visit | Attending: Obstetrics and Gynecology | Admitting: Obstetrics and Gynecology

## 2019-06-18 VITALS — BP 115/81 | HR 92 | Ht 61.5 in | Wt 209.0 lb

## 2019-06-18 DIAGNOSIS — Z01419 Encounter for gynecological examination (general) (routine) without abnormal findings: Secondary | ICD-10-CM | POA: Diagnosis not present

## 2019-06-18 DIAGNOSIS — Z0000 Encounter for general adult medical examination without abnormal findings: Secondary | ICD-10-CM

## 2019-06-18 DIAGNOSIS — Z113 Encounter for screening for infections with a predominantly sexual mode of transmission: Secondary | ICD-10-CM | POA: Diagnosis not present

## 2019-06-18 MED ORDER — NYSTATIN-TRIAMCINOLONE 100000-0.1 UNIT/GM-% EX OINT: 1.0000 "application " | TOPICAL_OINTMENT | Freq: Two times a day (BID) | CUTANEOUS | 0 refills | Status: DC

## 2019-06-18 NOTE — Progress Notes (Signed)
Pt presents for annual and all STD testing.  Normal pap 08/2018 Pt wants to start trying to conceive but concerned about HSV 2

## 2019-06-18 NOTE — Progress Notes (Signed)
Subjective:     Andrea Black is a 23 y.o. female P0 with LMP 05/20/19 and BMI 38 who is here for a comprehensive physical exam. The patient reports no problems. She is sexually active without contraception and plans to conceive. She denies any pelvic pain or abnormal discharge but admits to vulva pruritis  Past Medical History:  Diagnosis Date  . Diabetes mellitus without complication ([place])   . Polycystic ovary    Past Surgical History:  Procedure Laterality Date  . WISDOM TOOTH EXTRACTION     Family History  Problem Relation Age of Onset  . Colon cancer Mother   . Diabetes Mother   . Heart disease Father   . Polycystic ovary syndrome Paternal Grandmother     Social History   Socioeconomic History  . Marital status: Married    Spouse name: Not on file  . Number of children: Not on file  . Years of education: Not on file  . Highest education level: Not on file  Occupational History  . Not on file  Tobacco Use  . Smoking status: Light Tobacco Smoker  . Smokeless tobacco: Never Used  Substance and Sexual Activity  . Alcohol use: No    Alcohol/week: 0.0 standard drinks  . Drug use: Yes    Types: Marijuana  . Sexual activity: Yes    Birth control/protection: None  Other Topics Concern  . Not on file  Social History Narrative  . Not on file   Social Determinants of Health   Financial Resource Strain:   . Difficulty of Paying Living Expenses: Not on file  Food Insecurity:   . Worried About Charity fundraiser in the Last Year: Not on file  . Ran Out of Food in the Last Year: Not on file  Transportation Needs:   . Lack of Transportation (Medical): Not on file  . Lack of Transportation (Non-Medical): Not on file  Physical Activity:   . Days of Exercise per Week: Not on file  . Minutes of Exercise per Session: Not on file  Stress:   . Feeling of Stress : Not on file  Social Connections:   . Frequency of Communication with Friends and Family: Not on file  .  Frequency of Social Gatherings with Friends and Family: Not on file  . Attends Religious Services: Not on file  . Active Member of Clubs or Organizations: Not on file  . Attends Archivist Meetings: Not on file  . Marital Status: Not on file  Intimate Partner Violence:   . Fear of Current or Ex-Partner: Not on file  . Emotionally Abused: Not on file  . Physically Abused: Not on file  . Sexually Abused: Not on file   Health Maintenance  Topic Date Due  . URINE MICROALBUMIN  07/07/2006  . TETANUS/TDAP  07/08/2015  . INFLUENZA VACCINE  01/26/2019  . CHLAMYDIA SCREENING  09/14/2019  . PAP-Cervical Cytology Screening  09/13/2021  . PAP SMEAR-Modifier  09/13/2021  . HIV Screening  Completed       Review of Systems Pertinent items are noted in HPI.   Objective:  Blood pressure 115/81, pulse 92, height 5' 1.5" (1.562 m), weight 209 lb (94.8 kg), last menstrual period 05/20/2019.     GENERAL: Well-developed, well-nourished female in no acute distress.  HEENT: Normocephalic, atraumatic. Sclerae anicteric.  NECK: Supple. Normal thyroid.  LUNGS: Clear to auscultation bilaterally.  HEART: Regular rate and rhythm. BREASTS: Symmetric in size. No palpable masses or lymphadenopathy, skin changes,  or nipple drainage. ABDOMEN: Soft, nontender, nondistended. No organomegaly. PELVIC: Normal external female genitalia. Vagina is pink and rugated.  Normal discharge. Normal appearing cervix. Uterus is normal in size. No adnexal mass or tenderness. EXTREMITIES: No cyanosis, clubbing, or edema, 2+ distal pulses.    Assessment:    Healthy female exam.      Plan:    Patient with previous normal pap smear Wet prep collected A1c ordered STI screen per patient request Patient plans to conceive. Advised weight loss and taking prenatal vitamins RTC prn Patient will be contacted with abnormal results See After Visit Summary for Counseling Recommendations

## 2019-06-19 LAB — CERVICOVAGINAL ANCILLARY ONLY
Bacterial Vaginitis (gardnerella): POSITIVE — AB
Candida Glabrata: POSITIVE — AB
Candida Vaginitis: POSITIVE — AB
Chlamydia: NEGATIVE
Comment: NEGATIVE
Comment: NEGATIVE
Comment: NEGATIVE
Comment: NEGATIVE
Comment: NEGATIVE
Comment: NORMAL
Neisseria Gonorrhea: NEGATIVE
Trichomonas: NEGATIVE

## 2019-06-19 LAB — HEPATITIS B SURFACE ANTIGEN: Hepatitis B Surface Ag: NEGATIVE

## 2019-06-19 LAB — HEMOGLOBIN A1C
Est. average glucose Bld gHb Est-mCnc: 266 mg/dL
Hgb A1c MFr Bld: 10.9 % — ABNORMAL HIGH (ref 4.8–5.6)

## 2019-06-19 LAB — RPR: RPR Ser Ql: NONREACTIVE

## 2019-06-19 LAB — HEPATITIS C ANTIBODY: Hep C Virus Ab: <0.1 s/co ratio (ref 0.0–0.9)

## 2019-06-19 LAB — HIV ANTIBODY (ROUTINE TESTING W REFLEX): HIV Screen 4th Generation wRfx: NONREACTIVE

## 2019-06-19 MED ORDER — FLUCONAZOLE 150 MG PO TABS: 150.0000 mg | ORAL_TABLET | Freq: Once | ORAL | 0 refills | Status: AC

## 2019-06-19 MED ORDER — METRONIDAZOLE 500 MG PO TABS: 500.0000 mg | ORAL_TABLET | Freq: Two times a day (BID) | ORAL | 0 refills | Status: DC

## 2019-06-19 NOTE — Addendum Note (Signed)
Addended by: Mora Bellman on: 06/19/2019 09:40 PM   Modules accepted: Orders

## 2019-11-14 ENCOUNTER — Ambulatory Visit (INDEPENDENT_AMBULATORY_CARE_PROVIDER_SITE_OTHER): Payer: Self-pay

## 2019-11-14 ENCOUNTER — Other Ambulatory Visit: Payer: Self-pay

## 2019-11-14 VITALS — BP 122/81 | HR 75 | Ht 62.0 in | Wt 207.8 lb

## 2019-11-14 DIAGNOSIS — Z3201 Encounter for pregnancy test, result positive: Secondary | ICD-10-CM | POA: Diagnosis not present

## 2019-11-14 DIAGNOSIS — Z3400 Encounter for supervision of normal first pregnancy, unspecified trimester: Secondary | ICD-10-CM | POA: Insufficient documentation

## 2019-11-14 LAB — POCT URINE PREGNANCY: Preg Test, Ur: POSITIVE — AB

## 2019-11-14 MED ORDER — BLOOD PRESSURE KIT DEVI: 1.0000 | 0 refills | Status: DC

## 2019-11-14 NOTE — Progress Notes (Signed)
Andrea Black presents today for UPT. She has no unusual complaints.  LMP:  10/16/2019   [redacted]w[redacted]d EDD: 07/22/2020    OBJECTIVE: Appears well, in no apparent distress.  OB History    Gravida  1   Para  0   Term  0   Preterm  0   AB  0   Living  0     SAB  0   TAB  0   Ectopic  0   Multiple  0   Live Births             Home UPT Result: POSITIVE X 3 In-Office UPT result: POSITIVE  I have reviewed the patient's medical, obstetrical, social, and family histories, and medications.   ASSESSMENT: Positive pregnancy test  PLAN Prenatal care to be completed at: [facility]   ......................................................................  PRENATAL INTAKE SUMMARY  Andrea Black presents today New OB Nurse Interview.  OB History    Gravida  1   Para  0   Term  0   Preterm  0   AB  0   Living  0     SAB  0   TAB  0   Ectopic  0   Multiple  0   Live Births             I have reviewed the patient's medical, obstetrical, social, and family histories, medications, and available lab results.  SUBJECTIVE She has no unusual complaints and complains of nausea with vomiting for 14 days  OBJECTIVE Initial Nurse Interview (New OB)  GENERAL APPEARANCE: alert, well appearing   ASSESSMENT Normal  First Pregnancy LMP  10/16/2019 EDD  07/22/2020  PLAN Prenatal care at [facility] BP Cuff ordered, patient will pick up and take to NOB visit. New OB Labs will be done at [facility] visit.

## 2019-11-18 ENCOUNTER — Encounter (HOSPITAL_COMMUNITY): Payer: Self-pay | Admitting: Obstetrics and Gynecology

## 2019-11-18 ENCOUNTER — Other Ambulatory Visit: Payer: Self-pay

## 2019-11-18 ENCOUNTER — Inpatient Hospital Stay (HOSPITAL_COMMUNITY)
Admission: AD | Admit: 2019-11-18 | Discharge: 2019-11-18 | Disposition: A | Discharge status: Home / Self Care | Payer: Medicaid Other | Attending: Obstetrics and Gynecology | Admitting: Obstetrics and Gynecology

## 2019-11-18 ENCOUNTER — Inpatient Hospital Stay (HOSPITAL_COMMUNITY): Payer: Medicaid Other

## 2019-11-18 DIAGNOSIS — Z87891 Personal history of nicotine dependence: Secondary | ICD-10-CM | POA: Insufficient documentation

## 2019-11-18 DIAGNOSIS — R109 Unspecified abdominal pain: Secondary | ICD-10-CM | POA: Diagnosis not present

## 2019-11-18 DIAGNOSIS — O3680X0 Pregnancy with inconclusive fetal viability, not applicable or unspecified: Secondary | ICD-10-CM | POA: Diagnosis not present

## 2019-11-18 DIAGNOSIS — E119 Type 2 diabetes mellitus without complications: Secondary | ICD-10-CM | POA: Diagnosis not present

## 2019-11-18 DIAGNOSIS — O26891 Other specified pregnancy related conditions, first trimester: Secondary | ICD-10-CM | POA: Diagnosis present

## 2019-11-18 DIAGNOSIS — Z3A01 Less than 8 weeks gestation of pregnancy: Secondary | ICD-10-CM | POA: Diagnosis not present

## 2019-11-18 DIAGNOSIS — O3411 Maternal care for benign tumor of corpus uteri, first trimester: Secondary | ICD-10-CM | POA: Insufficient documentation

## 2019-11-18 DIAGNOSIS — D252 Subserosal leiomyoma of uterus: Secondary | ICD-10-CM | POA: Insufficient documentation

## 2019-11-18 DIAGNOSIS — Z833 Family history of diabetes mellitus: Secondary | ICD-10-CM | POA: Diagnosis not present

## 2019-11-18 DIAGNOSIS — O209 Hemorrhage in early pregnancy, unspecified: Secondary | ICD-10-CM

## 2019-11-18 DIAGNOSIS — O24111 Pre-existing diabetes mellitus, type 2, in pregnancy, first trimester: Secondary | ICD-10-CM | POA: Insufficient documentation

## 2019-11-18 DIAGNOSIS — Z3A00 Weeks of gestation of pregnancy not specified: Secondary | ICD-10-CM | POA: Diagnosis not present

## 2019-11-18 DIAGNOSIS — Z880 Allergy status to penicillin: Secondary | ICD-10-CM | POA: Insufficient documentation

## 2019-11-18 LAB — COMPREHENSIVE METABOLIC PANEL
ALT: 16 U/L (ref 0–44)
AST: 14 U/L — ABNORMAL LOW (ref 15–41)
Albumin: 4.1 g/dL (ref 3.5–5.0)
Alkaline Phosphatase: 56 U/L (ref 38–126)
Anion gap: 7 (ref 5–15)
BUN: 10 mg/dL (ref 6–20)
CO2: 27 mmol/L (ref 22–32)
Calcium: 9.2 mg/dL (ref 8.9–10.3)
Chloride: 100 mmol/L (ref 98–111)
Creatinine, Ser: 0.59 mg/dL (ref 0.44–1.00)
GFR calc Af Amer: >60 mL/min (ref >60)
GFR calc non Af Amer: >60 mL/min (ref >60)
Glucose, Bld: 231 mg/dL — ABNORMAL HIGH (ref 70–99)
Potassium: 4.4 mmol/L (ref 3.5–5.1)
Sodium: 134 mmol/L — ABNORMAL LOW (ref 135–145)
Total Bilirubin: 1.1 mg/dL (ref 0.3–1.2)
Total Protein: 7.3 g/dL (ref 6.5–8.1)

## 2019-11-18 LAB — CBC WITH DIFFERENTIAL/PLATELET
Abs Immature Granulocytes: 0.02 10*3/uL (ref 0.00–0.07)
Basophils Absolute: 0 10*3/uL (ref 0.0–0.1)
Basophils Relative: 0 %
Eosinophils Absolute: 0.1 10*3/uL (ref 0.0–0.5)
Eosinophils Relative: 1 %
HCT: 38.1 % (ref 36.0–46.0)
Hemoglobin: 13.2 g/dL (ref 12.0–15.0)
Immature Granulocytes: 0 %
Lymphocytes Relative: 29 %
Lymphs Abs: 2.2 10*3/uL (ref 0.7–4.0)
MCH: 30.6 pg (ref 26.0–34.0)
MCHC: 34.6 g/dL (ref 30.0–36.0)
MCV: 88.2 fL (ref 80.0–100.0)
Monocytes Absolute: 0.6 10*3/uL (ref 0.1–1.0)
Monocytes Relative: 8 %
Neutro Abs: 4.8 10*3/uL (ref 1.7–7.7)
Neutrophils Relative %: 62 %
Platelets: 248 10*3/uL (ref 150–400)
RBC: 4.32 MIL/uL (ref 3.87–5.11)
RDW: 11.8 % (ref 11.5–15.5)
WBC: 7.7 10*3/uL (ref 4.0–10.5)
nRBC: 0 % (ref 0.0–0.2)

## 2019-11-18 LAB — ABO/RH: ABO/RH(D): O POS

## 2019-11-18 LAB — HIV ANTIBODY (ROUTINE TESTING W REFLEX): HIV Screen 4th Generation wRfx: NONREACTIVE

## 2019-11-18 LAB — HCG, QUANTITATIVE, PREGNANCY: hCG, Beta Chain, Quant, S: 500 m[IU]/mL — ABNORMAL HIGH (ref <5)

## 2019-11-18 NOTE — MAU Provider Note (Signed)
[place]     CSN: [id number]  Arrival date and time: 11/18/19 1043   None     Chief Complaint  Patient presents with  . Abdominal Pain   HPI   Ms.Andrea Black is a 24 y.o. female G1P0000 @ 3w5dhere in MAU with complaints of abdominal pain. The Pain started when she found out she was pregnant. The pain comes and goes. The pain is located all along the lower part of her abdomen. She currently rates her pain 4/10. The pain waxes and wanes. She has not tried anything for the pain. She is concerned about the stress she has from working 2 jobs and how that will affect the pregnancy.   OB History    Gravida  1   Para  0   Term  0   Preterm  0   AB  0   Living  0     SAB  0   TAB  0   Ectopic  0   Multiple  0   Live Births              Past Medical History:  Diagnosis Date  . Anemia   . Depression   . Diabetes mellitus without complication ([place]    pre-diabetic   . Polycystic ovary     Past Surgical History:  Procedure Laterality Date  . WISDOM TOOTH EXTRACTION      Family History  Problem Relation Age of Onset  . Colon cancer Mother   . Diabetes Mother   . Arthritis Mother   . Cancer Mother   . Depression Mother   . Stroke Mother   . Heart disease Father   . Asthma Father   . Hypertension Father   . Polycystic ovary syndrome Paternal Grandmother   . Alcohol abuse Brother     Social History   Tobacco Use  . Smoking status: Former Smoker    Types: Cigarettes    Quit date: 11/04/2019    Years since quitting: 0.0  . Smokeless tobacco: Never Used  Substance Use Topics  . Alcohol use: No    Alcohol/week: 0.0 standard drinks  . Drug use: Yes    Types: Marijuana    Allergies:  Allergies  Allergen Reactions  . Penicillins Anaphylaxis    Family  History per mom prefers pt not to have    Medications Prior to Admission  Medication Sig Dispense Refill Last Dose  . Acetaminophen (TYLENOL PO) Take by mouth.     . Blood Pressure  Monitoring (BLOOD PRESSURE KIT) DEVI 1 kit by Does not apply route once a week. Check Blood Pressure regularly and record readings into the [employer] App.  Large Cuff.  DX O90.0 1 each 0   . HYDROcodone-acetaminophen (NORCO/VICODIN) 5-325 MG tablet Take 1-2 tablets by mouth every 6 (six) hours as needed. (Patient not taking: Reported on 11/14/2019) 20 tablet 0   . lidocaine (XYLOCAINE) 2 % solution Apply topically 4 times daily (Patient not taking: Reported on 11/14/2019) 200 mL 0   . metroNIDAZOLE (FLAGYL) 500 MG tablet Take 1 tablet (500 mg total) by mouth 2 (two) times daily. (Patient not taking: Reported on 11/14/2019) 14 tablet 0   . norgestimate-ethinyl estradiol (ORTHO-CYCLEN,SPRINTEC,PREVIFEM) 0.25-35 MG-MCG tablet Take 1 tablet by mouth daily. (Patient not taking: Reported on 06/18/2019) 1 Package 11   . nystatin-triamcinolone ointment (MYCOLOG) Apply 1 application topically 2 (two) times daily. (Patient not taking: Reported on 11/14/2019) 30 g 0   . valACYclovir (VALTREX)  1000 MG tablet Take TID for one week, then one daily until gone (Patient not taking: Reported on 11/14/2019) 100 tablet 2    Recent Results (from the past 2160 hour(s))  POCT urine pregnancy     Status: Abnormal   Collection Time: 11/14/19 11:47 AM  Result Value Ref Range   Preg Test, Ur Positive (A) Negative  CBC with Differential/Platelet     Status: None   Collection Time: 11/18/19  1:45 PM  Result Value Ref Range   WBC 7.7 4.0 - 10.5 K/uL   RBC 4.32 3.87 - 5.11 MIL/uL   Hemoglobin 13.2 12.0 - 15.0 g/dL   HCT 38.1 36.0 - 46.0 %   MCV 88.2 80.0 - 100.0 fL   MCH 30.6 26.0 - 34.0 pg   MCHC 34.6 30.0 - 36.0 g/dL   RDW 11.8 11.5 - 15.5 %   Platelets 248 150 - 400 K/uL   nRBC 0.0 0.0 - 0.2 %   Neutrophils Relative % 62 %   Neutro Abs 4.8 1.7 - 7.7 K/uL   Lymphocytes Relative 29 %   Lymphs Abs 2.2 0.7 - 4.0 K/uL   Monocytes Relative 8 %   Monocytes Absolute 0.6 0.1 - 1.0 K/uL   Eosinophils Relative 1 %    Eosinophils Absolute 0.1 0.0 - 0.5 K/uL   Basophils Relative 0 %   Basophils Absolute 0.0 0.0 - 0.1 K/uL   Immature Granulocytes 0 %   Abs Immature Granulocytes 0.02 0.00 - 0.07 K/uL    Comment: Performed at [facility], [address] [place], [place] [address]  Comprehensive metabolic panel     Status: Abnormal   Collection Time: 11/18/19  1:45 PM  Result Value Ref Range   Sodium 134 (L) 135 - 145 mmol/L   Potassium 4.4 3.5 - 5.1 mmol/L   Chloride 100 98 - 111 mmol/L   CO2 27 22 - 32 mmol/L   Glucose, Bld 231 (H) 70 - 99 mg/dL    Comment: Glucose reference range applies only to samples taken after fasting for at least 8 hours.   BUN 10 6 - 20 mg/dL   Creatinine, Ser 0.59 0.44 - 1.00 mg/dL   Calcium 9.2 8.9 - 10.3 mg/dL   Total Protein 7.3 6.5 - 8.1 g/dL   Albumin 4.1 3.5 - 5.0 g/dL   AST 14 (L) 15 - 41 U/L   ALT 16 0 - 44 U/L   Alkaline Phosphatase 56 38 - 126 U/L   Total Bilirubin 1.1 0.3 - 1.2 mg/dL   GFR calc non Af Amer >60 >60 mL/min   GFR calc Af Amer >60 >60 mL/min   Anion gap 7 5 - 15    Comment: Performed at [facility], [place] [address] [place], [place] [address]  ABO/Rh     Status: None   Collection Time: 11/18/19  1:45 PM  Result Value Ref Range   ABO/RH(D) O POS    No rh immune globuloin      NOT A RH IMMUNE GLOBULIN CANDIDATE, PT RH POSITIVE Performed at [facility] [address], [place], [place] [address]   HIV Antibody (routine testing w rflx)     Status: None   Collection Time: 11/18/19  1:45 PM  Result Value Ref Range   HIV Screen 4th Generation wRfx Non Reactive Non Reactive    Comment: Performed at [facility], [place] [address], [place], [place] [address]  hCG, quantitative, pregnancy  Status: Abnormal   Collection Time: 11/18/19  1:45 PM  Result Value Ref Range   hCG, Beta Chain, Quant, S 500 (H) <5 mIU/mL    Comment:          GEST. AGE      CONC.  (mIU/mL)   <=1 WEEK        5 - 50     2 WEEKS       50 - 500     3  WEEKS       100 - 10,000     4 WEEKS     1,000 - 30,000     5 WEEKS     3,500 - 115,000   6-8 WEEKS     12,000 - 270,000    12 WEEKS     15,000 - 220,000        FEMALE AND NON-PREGNANT FEMALE:     LESS THAN 5 mIU/mL Performed at [facility], [place] [address], [place], [place] [address]    US OB LESS THAN 14 WEEKS WITH OB TRANSVAGINAL  Result Date: 11/18/2019 CLINICAL DATA:  Lower abdominal pain EXAM: OBSTETRIC <14 WK [place] AND TRANSVAGINAL OB US TECHNIQUE: Both transabdominal and transvaginal ultrasound examinations were performed for complete evaluation of the gestation as well as the maternal uterus, adnexal regions, and pelvic cul-de-sac. Transvaginal technique was performed to assess early pregnancy. COMPARISON:  04/12/2016 FINDINGS: Intrauterine gestational sac: None Yolk sac:  Not Visualized. Embryo:  Not Visualized. Maternal uterus/adnexae: Uterus is grossly unremarkable. There is a small subserosal fibroid posteriorly measuring 1.2 x 0.9 x 1.1 cm. The right ovary measures 4.8 x 2.1 x 1.9 cm in the left ovary measures 2.7 x 1.7 x 1.5 cm. No free fluid. IMPRESSION: 1. No evidence of intrauterine pregnancy at this time. Serial beta HCG measurements as well as follow-up ultrasound may be needed to document a live intrauterine pregnancy. 2. Small uterine fibroid.  Otherwise unremarkable exam. Electronically Signed   By: Randa Ngo M.D.   On: 11/18/2019 14:59    Review of Systems  Constitutional: Negative for fever.  Gastrointestinal: Positive for abdominal pain.  Genitourinary: Negative for dysuria, vaginal bleeding, vaginal discharge and vaginal pain.   Physical Exam   Blood pressure 129/77, pulse 76, temperature 98.7 F (37.1 C), temperature source Oral, resp. rate 16, height '5\' 2"'  (1.575 m), weight 92.4 kg, last menstrual period 10/16/2019, SpO2 100 %.  Physical Exam  Constitutional: She is oriented to person, place, and time. She appears well-developed and well-nourished. No  distress.  HENT:  Head: Normocephalic.  Eyes: Pupils are equal, round, and reactive to light.  GI: Soft. She exhibits no distension. There is no abdominal tenderness. There is no rebound and no guarding.  Musculoskeletal:        General: Normal range of motion.  Neurological: She is alert and oriented to person, place, and time.  Skin: Skin is warm. She is not diaphoretic.  Psychiatric: Her behavior is normal.   MAU Course  Procedures  None  MDM  Wet prep & GC- patient declined  CBC, Hcg, ABO US OB transvaginal   Assessment and Plan   A:  Pregnancy of unknown location Abdominal pain in pregnancy   P:  Discharge home in stable condition Ectopic precautions Return to [place] on Thursday @ 0900 for stat Quant Return to MAU if symptoms worsen Increase oral fluid intake Pelvic rest  Virgilene Stryker, Artist Pais, NP 11/18/2019 4:40 PM

## 2019-11-18 NOTE — MAU Note (Signed)
Pt c/o lower abdominal pain intermittent "every now and then", rates 3/10, denies VB. Prenatal visit at [facility] for dx pregnancy.

## 2019-11-18 NOTE — Discharge Instructions (Signed)
Abdominal Pain During Pregnancy  Abdominal pain is common during pregnancy, and has many possible causes. Some causes are more serious than others, and sometimes the cause is not known. Abdominal pain can be a sign that labor is starting. It can also be caused by normal growth and stretching of muscles and ligaments during pregnancy. Always tell your health care provider if you have any abdominal pain. Follow these instructions at home:  Do not have sex or put anything in your vagina until your pain goes away completely.  Get plenty of rest until your pain improves.  Drink enough fluid to keep your urine pale yellow.  Take over-the-counter and prescription medicines only as told by your health care provider.  Keep all follow-up visits as told by your health care provider. This is important. Contact a health care provider if:  Your pain continues or gets worse after resting.  You have lower abdominal pain that: ? Comes and goes at regular intervals. ? Spreads to your back. ? Is similar to menstrual cramps.  You have pain or burning when you urinate. Get help right away if:  You have a fever or chills.  You have vaginal bleeding.  You are leaking fluid from your vagina.  You are passing tissue from your vagina.  You have vomiting or diarrhea that lasts for more than 24 hours.  Your baby is moving less than usual.  You feel very weak or faint.  You have shortness of breath.  You develop severe pain in your upper abdomen. Summary  Abdominal pain is common during pregnancy, and has many possible causes.  If you experience abdominal pain during pregnancy, tell your health care provider right away.  Follow your health care provider's home care instructions and keep all follow-up visits as directed. This information is not intended to replace advice given to you by your health care provider. Make sure you discuss any questions you have with your health care  provider. Document Revised: 10/01/2018 Document Reviewed: 09/15/2016 Elsevier Patient Education  2020 Elsevier Inc.  

## 2019-11-18 NOTE — MAU Note (Signed)
Pt reports lower abdominal cramping since 5/19. Rating it a 3.5-7/10 that comes and goes. Denies VB. Positive pregnancy test on 11/14/2019. Last intercourse 11/10/2019.

## 2019-11-18 NOTE — ED Provider Notes (Signed)
MSE was initiated and I personally evaluated the patient and placed orders (if any) at  11:36 AM on Nov 18, 2019.  The patient appears stable so that the remainder of the MSE may be completed by another provider.  Spoke with Erin from MAU.  Patient to be transferred to MAU.  Well-appearing 24 year old female   Andrea Black 11/18/19 1137    Little, Wenda Overland, MD 11/18/19 1143

## 2019-11-20 NOTE — MAU Note (Signed)
Accessed chart to print pregnancy verification letter.

## 2019-11-21 ENCOUNTER — Other Ambulatory Visit (INDEPENDENT_AMBULATORY_CARE_PROVIDER_SITE_OTHER): Payer: Medicaid Other

## 2019-11-21 ENCOUNTER — Other Ambulatory Visit: Payer: Medicaid Other

## 2019-11-21 ENCOUNTER — Other Ambulatory Visit: Payer: Self-pay

## 2019-11-21 ENCOUNTER — Inpatient Hospital Stay (HOSPITAL_COMMUNITY)
Admission: AD | Admit: 2019-11-21 | Discharge: 2019-11-21 | Disposition: A | Discharge status: Home / Self Care | Payer: Medicaid Other | Attending: Obstetrics and Gynecology | Admitting: Obstetrics and Gynecology

## 2019-11-21 VITALS — Wt 203.0 lb

## 2019-11-21 DIAGNOSIS — O26891 Other specified pregnancy related conditions, first trimester: Secondary | ICD-10-CM | POA: Diagnosis not present

## 2019-11-21 DIAGNOSIS — R109 Unspecified abdominal pain: Secondary | ICD-10-CM | POA: Insufficient documentation

## 2019-11-21 DIAGNOSIS — O26899 Other specified pregnancy related conditions, unspecified trimester: Secondary | ICD-10-CM | POA: Diagnosis not present

## 2019-11-21 DIAGNOSIS — Z3A01 Less than 8 weeks gestation of pregnancy: Secondary | ICD-10-CM | POA: Diagnosis not present

## 2019-11-21 LAB — BETA HCG QUANT (REF LAB): hCG Quant: 1415 m[IU]/mL

## 2019-11-21 LAB — HCG, QUANTITATIVE, PREGNANCY: hCG, Beta Chain, Quant, S: 1900 m[IU]/mL — ABNORMAL HIGH (ref <5)

## 2019-11-21 NOTE — MAU Note (Signed)
Doing ok, states abd cramping has subsided.  Denies bleeding.  Still has breast tenderness and morning sickness.

## 2019-11-21 NOTE — Progress Notes (Addendum)
SUBJECTIVE OB presents for STAT HCG follow up from MAU.   PLAN STAT HCG drawn and sent to lab.  Patient will be called with results.  She is on her way to get FU Ultrasound done.   --------------------------------------------------------  Patient notified of results and FU HCG scheduled for 11/26/2019 we are closed on 11/25/2019. She verbalized understanding and agreement.

## 2019-11-23 ENCOUNTER — Other Ambulatory Visit: Payer: Self-pay

## 2019-11-23 ENCOUNTER — Inpatient Hospital Stay (HOSPITAL_COMMUNITY)
Admission: AD | Admit: 2019-11-23 | Discharge: 2019-11-23 | Disposition: A | Discharge status: Home / Self Care | Payer: Medicaid Other | Attending: Obstetrics and Gynecology | Admitting: Obstetrics and Gynecology

## 2019-11-23 DIAGNOSIS — O3680X0 Pregnancy with inconclusive fetal viability, not applicable or unspecified: Secondary | ICD-10-CM | POA: Diagnosis not present

## 2019-11-23 DIAGNOSIS — Z3A01 Less than 8 weeks gestation of pregnancy: Secondary | ICD-10-CM | POA: Insufficient documentation

## 2019-11-23 DIAGNOSIS — Z3689 Encounter for other specified antenatal screening: Secondary | ICD-10-CM | POA: Diagnosis not present

## 2019-11-23 NOTE — MAU Note (Signed)
Andrea Black is a 24 y.o. at [redacted]w[redacted]d here in MAU reporting: states on Thursday somebody from the hospital told her to come back today for an ultrasound. No pain, bleeding, or discharge.  Pain score: 0/10  Vitals:   11/23/19 1304  BP: 121/76  Pulse: 74  Resp: 16  Temp: 99.1 F (37.3 C)  SpO2: 99%     Lab orders placed from triage: none

## 2019-11-23 NOTE — Discharge Instructions (Signed)
First Trimester of Pregnancy  The first trimester of pregnancy is from week 1 until the end of week 13 (months 1 through 3). During this time, your baby will begin to develop inside you. At 6-8 weeks, the eyes and face are formed, and the heartbeat can be seen on ultrasound. At the end of 12 weeks, all the baby's organs are formed. Prenatal care is all the medical care you receive before the birth of your baby. Make sure you get good prenatal care and follow all of your doctor's instructions. Follow these instructions at home: Medicines  Take over-the-counter and prescription medicines only as told by your doctor. Some medicines are safe and some medicines are not safe during pregnancy.  Take a prenatal vitamin that contains at least 600 micrograms (mcg) of folic acid.  If you have trouble pooping (constipation), take medicine that will make your stool soft (stool softener) if your doctor approves. Eating and drinking   Eat regular, healthy meals.  Your doctor will tell you the amount of weight gain that is right for you.  Avoid raw meat and uncooked cheese.  If you feel sick to your stomach (nauseous) or throw up (vomit): ? Eat 4 or 5 small meals a day instead of 3 large meals. ? Try eating a few soda crackers. ? Drink liquids between meals instead of during meals.  To prevent constipation: ? Eat foods that are high in fiber, like fresh fruits and vegetables, whole grains, and beans. ? Drink enough fluids to keep your pee (urine) clear or pale yellow. Activity  Exercise only as told by your doctor. Stop exercising if you have cramps or pain in your lower belly (abdomen) or low back.  Do not exercise if it is too hot, too humid, or if you are in a place of great height (high altitude).  Try to avoid standing for long periods of time. Move your legs often if you must stand in one place for a long time.  Avoid heavy lifting.  Wear low-heeled shoes. Sit and stand up  straight.  You can have sex unless your doctor tells you not to. Relieving pain and discomfort  Wear a good support bra if your breasts are sore.  Take warm water baths (sitz baths) to soothe pain or discomfort caused by hemorrhoids. Use hemorrhoid cream if your doctor says it is okay.  Rest with your legs raised if you have leg cramps or low back pain.  If you have puffy, bulging veins (varicose veins) in your legs: ? Wear support hose or compression stockings as told by your doctor. ? Raise (elevate) your feet for 15 minutes, 3-4 times a day. ? Limit salt in your food. Prenatal care  Schedule your prenatal visits by the twelfth week of pregnancy.  Write down your questions. Take them to your prenatal visits.  Keep all your prenatal visits as told by your doctor. This is important. Safety  Wear your seat belt at all times when driving.  Make a list of emergency phone numbers. The list should include numbers for family, friends, the hospital, and police and fire departments. General instructions  Ask your doctor for a referral to a local prenatal class. Begin classes no later than at the start of month 6 of your pregnancy.  Ask for help if you need counseling or if you need help with nutrition. Your doctor can give you advice or tell you where to go for help.  Do not use hot tubs, steam   rooms, or saunas.  Do not douche or use tampons or scented sanitary pads.  Do not cross your legs for long periods of time.  Avoid all herbs and alcohol. Avoid drugs that are not approved by your doctor.  Do not use any tobacco products, including cigarettes, chewing tobacco, and electronic cigarettes. If you need help quitting, ask your doctor. You may get counseling or other support to help you quit.  Avoid cat litter boxes and soil used by cats. These carry germs that can cause birth defects in the baby and can cause a loss of your baby (miscarriage) or stillbirth.  Visit your dentist.  At home, brush your teeth with a soft toothbrush. Be gentle when you floss. Contact a doctor if:  You are dizzy.  You have mild cramps or pressure in your lower belly.  You have a nagging pain in your belly area.  You continue to feel sick to your stomach, you throw up, or you have watery poop (diarrhea).  You have a bad smelling fluid coming from your vagina.  You have pain when you pee (urinate).  You have increased puffiness (swelling) in your face, hands, legs, or ankles. Get help right away if:  You have a fever.  You are leaking fluid from your vagina.  You have spotting or bleeding from your vagina.  You have very bad belly cramping or pain.  You gain or lose weight rapidly.  You throw up blood. It may look like coffee grounds.  You are around people who have German measles, fifth disease, or chickenpox.  You have a very bad headache.  You have shortness of breath.  You have any kind of trauma, such as from a fall or a car accident. Summary  The first trimester of pregnancy is from week 1 until the end of week 13 (months 1 through 3).  To take care of yourself and your unborn baby, you will need to eat healthy meals, take medicines only if your doctor tells you to do so, and do activities that are safe for you and your baby.  Keep all follow-up visits as told by your doctor. This is important as your doctor will have to ensure that your baby is healthy and growing well. This information is not intended to replace advice given to you by your health care provider. Make sure you discuss any questions you have with your health care provider. Document Revised: 10/04/2018 Document Reviewed: 06/21/2016 Elsevier Patient Education  2020 Elsevier Inc.  

## 2019-11-23 NOTE — MAU Provider Note (Signed)
[name]  is a 25 y.o. G1P0000 at [redacted]w[redacted]d who presents to MAU today stating she needed an [place]. The patient had hCG drawn in MAU on 5/24 and level was 500. She went to the [facility] on 5/27 and her hCG was 1415. She also presented to MAU that same day and hCG was 1900. It is unclear why she had labs drawn in both locations. Patient denies pain or bleeding today.   BP 121/76 (BP Location: Right Arm)   Pulse 74   Temp 99.1 F (37.3 C) (Oral)   Resp 16   Ht 5\' 2"  (1.575 m)   Wt 92.6 kg   LMP 10/16/2019 (Exact Date)   SpO2 99% Comment: room air  BMI 37.33 kg/m   CONSTITUTIONAL: Well-developed, well-nourished female in no acute distress.  EYES: EOM intact, conjunctivae normal.  CARDIOVASCULAR: Regular heart rate RESPIRATORY: Normal effort NEUROLOGICAL: Alert and oriented to person, place, and time.  SKIN: Skin is warm and dry. No rash noted. Not diaphoretic. No erythema. No pallor. PSYCH: Normal mood and affect. Normal behavior. Normal judgment and thought content.  Results for MAHITHA, ARMENTI (MRN [id number]) as of 11/23/2019 16:34  Ref. Range 11/18/2019 13:45 11/18/2019 14:50 11/21/2019 08:54 11/21/2019 09:53  hCG Quant Latest Units: mIU/mL   1,415   HCG, Beta Chain, Quant, S Latest Ref Range: <5 mIU/mL 500 (H)   1,900 (H)   A: Appropriate rise in quant hCG after 48 hours noted earlier this week  P: Discharge home First trimester/ectopic precautions discussed Patient advised that [place] today is not indicated given her symptoms have not changed and it is too early to expect enough progression to be seen on [place] compared to her previous visit Patient will return for follow-up US later this week. Order placed. They will call the patient with an appointment time. In-basket message sent to [facility] to cancel lab appointment on Tuesday and scheduled virtual results visit following [place].  Patient may return to MAU as needed or if her condition were to change or worsen   Danielle Rankin 11/23/2019 4:31 PM

## 2019-11-26 ENCOUNTER — Other Ambulatory Visit: Payer: Medicaid Other

## 2019-11-26 NOTE — MAU Provider Note (Signed)
Subjective:  Andrea Black is a 24 y.o. G1P0000 at [redacted]w[redacted]d who presents today for FU BHCG. She was seen on 11/18/2019. Results from that day show no IUP on [place], and HCG 500. She denies vaginal bleeding. She denies abdominal or pelvic pain.   Objective:  Physical Exam  Nursing note and vitals reviewed.  Constitutional: She is oriented to person, place, and time. She appears well-developed and well-nourished. No distress.  HENT:  Head: Normocephalic.  Cardiovascular: Normal rate.  Respiratory: Effort normal.  GI: Soft. There is no tenderness.  Neurological: She is alert and oriented to person, place, and time. Skin: Skin is warm and dry.  Psychiatric: She has a normal mood and affect.   No results found for this or any previous visit (from the past 24 hour(s)).  Assessment/Plan: Pregnancy of unknown location HCG did  rise appropriately FU in 2 days for: repeat hCG  Due to an error in the Epic system, this note is being completed today and a system ticket has been entered to correct this problem.  Clarisa Fling, NP  12:11 PM 11/26/2019

## 2019-12-03 ENCOUNTER — Other Ambulatory Visit: Payer: Self-pay

## 2019-12-03 ENCOUNTER — Ambulatory Visit
Admission: RE | Admit: 2019-12-03 | Discharge: 2019-12-03 | Disposition: A | Discharge status: Home / Self Care | Payer: Medicaid Other | Source: Ambulatory Visit | Attending: Medical | Admitting: Medical

## 2019-12-03 DIAGNOSIS — O3680X0 Pregnancy with inconclusive fetal viability, not applicable or unspecified: Secondary | ICD-10-CM | POA: Diagnosis not present

## 2019-12-03 DIAGNOSIS — Z3A01 Less than 8 weeks gestation of pregnancy: Secondary | ICD-10-CM | POA: Diagnosis not present

## 2019-12-04 ENCOUNTER — Encounter: Payer: Self-pay | Admitting: Obstetrics

## 2019-12-04 ENCOUNTER — Telehealth (INDEPENDENT_AMBULATORY_CARE_PROVIDER_SITE_OTHER): Payer: Medicaid Other | Admitting: Obstetrics

## 2019-12-04 DIAGNOSIS — Z3A01 Less than 8 weeks gestation of pregnancy: Secondary | ICD-10-CM

## 2019-12-04 DIAGNOSIS — Z3491 Encounter for supervision of normal pregnancy, unspecified, first trimester: Secondary | ICD-10-CM

## 2019-12-04 NOTE — Progress Notes (Signed)
OBSTETRICS PRENATAL VIRTUAL VISIT ENCOUNTER NOTE  Provider location: [facility] [place]   I connected with Andrea Black on 12/04/19 at  1:00 PM EDT by MyChart Video Encounter at home and verified that I am speaking with the correct person using two identifiers.   I discussed the limitations, risks, security and privacy concerns of performing an evaluation and management service virtually and the availability of in person appointments. I also discussed with the patient that there may be a patient responsible charge related to this service. The patient expressed understanding and agreed to proceed. Subjective:  Andrea Black is a 24 y.o. G1P0000 at [redacted]w[redacted]d being seen today for ongoing prenatal care.  She is currently monitored for the following issues for this low-risk pregnancy and has Abnormal granulation tissue of buttock; Obesity; Dysmenorrhea; Polycystic ovarian disease; Elevated hemoglobin A1c; Prolactin increased; Exposure to sexually transmitted disease (STD); Supervision of normal first pregnancy; and Abdominal pain in pregnancy, first trimester on their problem list.  Patient reports no complaints.   .  .   . Denies any leaking of fluid.   The following portions of the patient's history were reviewed and updated as appropriate: allergies, current medications, past family history, past medical history, past social history, past surgical history and problem list.   Objective:  There were no vitals filed for this visit.  Fetal Status:           General:  Alert, oriented and cooperative. Patient is in no acute distress.  Respiratory: Normal respiratory effort, no problems with respiration noted  Mental Status: Normal mood and affect. Normal behavior. Normal judgment and thought content.  Rest of physical exam deferred due to type of encounter  Imaging: US OB Transvaginal  Result Date: 12/03/2019 CLINICAL DATA:  Pregnancy of unknown location.  Follow-up  viability. EXAM: TRANSVAGINAL OB ULTRASOUND TECHNIQUE: Transvaginal ultrasound was performed for complete evaluation of the gestation as well as the maternal uterus, adnexal regions, and pelvic cul-de-sac. COMPARISON:  Obstetric ultrasound 11/18/2019 FINDINGS: Intrauterine gestational sac: Single Yolk sac:  Visualized. Embryo:  Visualized. Cardiac Activity: Visualized. Heart Rate: 126 bpm CRL:   6.7  mm   6 w 3 d                  [place] EDC: 07/25/2020 Subchorionic hemorrhage:  None visualized. Maternal uterus/adnexae: Single live intrauterine pregnancy. No subchorionic hemorrhage. 1.5 cm fundal fibroid. Both ovaries are visualized and are normal. Corpus luteal cyst on the right. Trace free fluid in the pelvis. No suspicious adnexal mass. IMPRESSION: Single live intrauterine pregnancy estimated gestational age [redacted] weeks 3 days based on crown-rump length for ultrasound [facility] 07/25/2020. No subchorionic hemorrhage. Electronically Signed   By: Keith Rake M.D.   On: 12/03/2019 16:24   US OB LESS THAN 14 WEEKS WITH OB TRANSVAGINAL  Result Date: 11/18/2019 CLINICAL DATA:  Lower abdominal pain EXAM: OBSTETRIC <14 WK [place] AND TRANSVAGINAL OB US TECHNIQUE: Both transabdominal and transvaginal ultrasound examinations were performed for complete evaluation of the gestation as well as the maternal uterus, adnexal regions, and pelvic cul-de-sac. Transvaginal technique was performed to assess early pregnancy. COMPARISON:  04/12/2016 FINDINGS: Intrauterine gestational sac: None Yolk sac:  Not Visualized. Embryo:  Not Visualized. Maternal uterus/adnexae: Uterus is grossly unremarkable. There is a small subserosal fibroid posteriorly measuring 1.2 x 0.9 x 1.1 cm. The right ovary measures 4.8 x 2.1 x 1.9 cm in the left ovary measures 2.7 x 1.7 x 1.5 cm. No free fluid. IMPRESSION:  1. No evidence of intrauterine pregnancy at this time. Serial beta HCG measurements as well as follow-up ultrasound may be needed to document a live  intrauterine pregnancy. 2. Small uterine fibroid.  Otherwise unremarkable exam. Electronically Signed   By: Randa Ngo M.D.   On: 11/18/2019 14:59    Assessment and Plan:  Pregnancy: G1P0000 at [redacted]w[redacted]d 1. Viable pregnancy in first trimester - appointment for NOB Visit scheduled   Preterm labor symptoms and general obstetric precautions including but not limited to vaginal bleeding, contractions, leaking of fluid and fetal movement were reviewed in detail with the patient. I discussed the assessment and treatment plan with the patient. The patient was provided an opportunity to ask questions and all were answered. The patient agreed with the plan and demonstrated an understanding of the instructions. The patient was advised to call back or seek an in-person office evaluation/go to [facility]U at [facility] for any urgent or concerning symptoms. Please refer to After Visit Summary for other counseling recommendations.   I provided 15 minutes of face-to-face time during this encounter.  Return in about 29 days (around 01/02/2020) for NOB Visit.  Future Appointments  Date Time Provider [place]  01/02/2020 10:15 AM Nugent, Gerrie Nordmann, NP [place] None    Baltazar Najjar, [place] [facility], [place] [facility] 12/04/19

## 2019-12-23 ENCOUNTER — Other Ambulatory Visit: Payer: Self-pay | Admitting: Obstetrics

## 2019-12-23 DIAGNOSIS — A6009 Herpesviral infection of other urogenital tract: Secondary | ICD-10-CM

## 2019-12-23 MED ORDER — VALACYCLOVIR HCL 500 MG PO TABS: 500.0000 mg | ORAL_TABLET | Freq: Two times a day (BID) | ORAL | 11 refills | Status: DC

## 2019-12-26 ENCOUNTER — Encounter: Payer: Medicaid Other | Admitting: Family Medicine

## 2020-01-02 ENCOUNTER — Encounter: Payer: Self-pay | Admitting: Women's Health

## 2020-01-02 ENCOUNTER — Other Ambulatory Visit: Payer: Self-pay

## 2020-01-02 ENCOUNTER — Ambulatory Visit (INDEPENDENT_AMBULATORY_CARE_PROVIDER_SITE_OTHER): Payer: Medicaid Other | Admitting: Women's Health

## 2020-01-02 ENCOUNTER — Other Ambulatory Visit (HOSPITAL_COMMUNITY)
Admission: RE | Admit: 2020-01-02 | Discharge: 2020-01-02 | Disposition: A | Discharge status: Home / Self Care | Payer: Medicaid Other | Source: Ambulatory Visit | Attending: Family Medicine | Admitting: Family Medicine

## 2020-01-02 VITALS — BP 112/80 | HR 71 | Wt 203.0 lb

## 2020-01-02 DIAGNOSIS — Z3401 Encounter for supervision of normal first pregnancy, first trimester: Secondary | ICD-10-CM

## 2020-01-02 DIAGNOSIS — Z3A11 11 weeks gestation of pregnancy: Secondary | ICD-10-CM | POA: Diagnosis not present

## 2020-01-02 DIAGNOSIS — Z8619 Personal history of other infectious and parasitic diseases: Secondary | ICD-10-CM

## 2020-01-02 DIAGNOSIS — E669 Obesity, unspecified: Secondary | ICD-10-CM

## 2020-01-02 DIAGNOSIS — O24311 Unspecified pre-existing diabetes mellitus in pregnancy, first trimester: Secondary | ICD-10-CM | POA: Diagnosis not present

## 2020-01-02 DIAGNOSIS — O99210 Obesity complicating pregnancy, unspecified trimester: Secondary | ICD-10-CM

## 2020-01-02 LAB — OB RESULTS CONSOLE GBS: GBS: POSITIVE

## 2020-01-02 MED ORDER — ASPIRIN EC 81 MG PO TBEC: 81.0000 mg | DELAYED_RELEASE_TABLET | Freq: Every day | ORAL | 11 refills | Status: DC

## 2020-01-02 NOTE — Progress Notes (Signed)
Patient presents for NOB today. NOB Intake Completed 11/14/19. B/P Cuff already ordered on 11/14/19  Planned pregnancy: Yes   LMP: 10/16/19  Last Pap: 09/14/2018 WNL   Genetic Screening : Desires    CC: Morning Sickness , pt notes HA's sometimes. Pain and pressure in back

## 2020-01-02 NOTE — Progress Notes (Signed)
History:   Andrea Black is a 24 y.o. G1P0000 at 34w1dby LMP being seen today for her first obstetrical visit.  Her obstetrical history is significant for obesity. Patient does intend to breast feed. Pregnancy history fully reviewed.  Pt reports this is a desired and planned pregnancy. Allergies: PCN Current Medications: PNVs PMH: No HTN, DM, asthma. Patient reports she was diagnosed with pre-diabetes 2015, but did not have close follow-up. Patient reports she took Metformin in the past but stopped taking it because of the side effects. PSH: wisdom teeth removal OB Hx: first pregnancy Social Hx: pt does not smoke, drink, or use drugs. Family Hx: none.  Patient reports no complaints.      HISTORY: OB History  Gravida Para Term Preterm AB Living  1 0 0 0 0 0  SAB TAB Ectopic Multiple Live Births  0 0 0 0 0    # Outcome Date GA Lbr Len/2nd Weight Sex Delivery Anes PTL Lv  1 Current             Last pap smear was done 08/2018 and was normal  Past Medical History:  Diagnosis Date  . Anemia   . Depression   . Diabetes mellitus without complication ([place]    pre-diabetic   . Polycystic ovary    Past Surgical History:  Procedure Laterality Date  . WISDOM TOOTH EXTRACTION     Family History  Problem Relation Age of Onset  . Colon cancer Mother   . Diabetes Mother   . Arthritis Mother   . Cancer Mother   . Depression Mother   . Stroke Mother   . Heart disease Father   . Asthma Father   . Hypertension Father   . Polycystic ovary syndrome Paternal Grandmother   . Alcohol abuse Brother    Social History   Tobacco Use  . Smoking status: Former Smoker    Types: Cigarettes    Quit date: 11/04/2019    Years since quitting: 0.1  . Smokeless tobacco: Never Used  Vaping Use  . Vaping Use: Never used  Substance Use Topics  . Alcohol use: No    Alcohol/week: 0.0 standard drinks  . Drug use: Yes    Types: Marijuana   Allergies  Allergen Reactions  .  Penicillins Anaphylaxis    Family  History per mom prefers pt not to have   Current Outpatient Medications on File Prior to Visit  Medication Sig Dispense Refill  . Acetaminophen (TYLENOL PO) Take by mouth.    . Blood Pressure Monitoring (BLOOD PRESSURE KIT) DEVI 1 kit by Does not apply route once a week. Check Blood Pressure regularly and record readings into the Babyscripts App.  Large Cuff.  DX O90.0 1 each 0  . Prenatal Vit-Fe Fumarate-FA (MULTIVITAMIN-PRENATAL) 27-0.8 MG TABS tablet Take 1 tablet by mouth daily at 12 noon.    . valACYclovir (VALTREX) 500 MG tablet Take 1 tablet (500 mg total) by mouth 2 (two) times daily. Take for 3 days as needed for each recurrence. 30 tablet 11   No current facility-administered medications on file prior to visit.    Review of Systems Pertinent items noted in HPI and remainder of comprehensive ROS otherwise negative. Physical Exam:   Vitals:   01/02/20 1007  BP: 112/80  Pulse: 71  Weight: 203 lb (92.1 kg)     Bedside Ultrasound for FHR check: Viable intrauterine pregnancy with positive cardiac activity noted. Patient informed that the ultrasound is considered a limited  obstetric ultrasound and is not intended to be a complete ultrasound exam.  Patient also informed that the ultrasound is not being completed with the intent of assessing for fetal or placental anomalies or any pelvic abnormalities.  Explained that the purpose of today's ultrasound is to assess for fetal heart rate.  Patient acknowledges the purpose of the exam and the limitations of the study. Uterus:   c/w 11week size  Pelvic Exam: Perineum: no hemorrhoids, normal perineum   Vulva: normal external genitalia, no lesions   Vagina:  deferred   Cervix: deferred   Adnexa: deferred   Bony Pelvis: deferred  System: General: well-developed, well-nourished female in no acute distress   Breasts:  normal appearance, no masses or tenderness bilaterally   Skin: normal coloration and  turgor, no rashes   Neurologic: oriented, normal, negative, normal mood   Extremities: normal strength, tone, and muscle mass, ROM of all joints is normal   HEENT PERRLA, extraocular movement intact and sclera clear, anicteric   Mouth/Teeth mucous membranes moist, pharynx normal without lesions and dental hygiene good   Neck supple and no masses   Cardiovascular: regular rate and rhythm   Respiratory:  no respiratory distress, normal breath sounds   Abdomen: soft, non-tender; bowel sounds normal; no masses,  no organomegaly    Assessment:    Pregnancy: G1P0000 Patient Active Problem List   Diagnosis Date Noted  . Abdominal pain in pregnancy, first trimester 11/18/2019  . Supervision of normal first pregnancy 11/14/2019  . Exposure to sexually transmitted disease (STD) 02/15/2017  . Elevated hemoglobin A1c 04/19/2016  . Prolactin increased 04/19/2016  . Polycystic ovarian disease 04/14/2016  . Obesity 04/01/2016  . Dysmenorrhea 04/01/2016  . Abnormal granulation tissue of buttock 08/05/2013     Plan:    1. Encounter for supervision of normal first pregnancy in first trimester - AFP next visit - Culture, OB Urine - Genetic Screening - Cervicovaginal ancillary only( [facility]) - CBC/D/Plt+RPR+Rh+ABO+Rub Ab... - Enroll Patient in Babyscripts - [place] MFM OB COMP + 14 WK; Future  2. Obesity in pregnancy - HgB A1c - aspirin EC 81 MG tablet; Take 1 tablet (81 mg total) by mouth daily. Swallow whole.  Dispense: 30 tablet; Refill: 11 - pt aware to start aspirin at 12 weeks  3. History of herpes genitalis - suppression at 35/36 weeks   Initial labs drawn. Continue prenatal vitamins. Problem list reviewed and updated. Genetic Screening discussed, NIPS: requested. Ultrasound discussed; fetal anatomic survey: ordered. Anticipatory guidance about prenatal visits given including labs, ultrasounds, and testing. Discussed usage of Babyscripts and virtual visits as additional source of  managing and completing prenatal visits in midst of coronavirus and pandemic.   Encouraged to complete MyChart Registration for her ability to review results, send requests, and have questions addressed.  The nature of [place] for [facility]Faculty Practice with multiple MDs and Advanced Practice Providers was explained to patient; also emphasized that residents, students are part of our team. Routine obstetric precautions reviewed. Encouraged to seek out care at office or emergency room [facility] MAU preferred) for urgent and/or emergent concerns. Return in about 4 weeks (around 01/30/2020) for in-person LOB/APP OK/AFP.     Clarisa Fling, NP  11:02 AM 01/02/2020

## 2020-01-02 NOTE — Patient Instructions (Signed)
Maternity Assessment Unit (MAU)  The Maternity Assessment Unit (MAU) is located at the [facility] and [place] [facility]. The address is: [address], [place], [place], [place] [address]. Please see map below for additional directions.    The Maternity Assessment Unit is designed to help you during your pregnancy, and for up to 6 weeks after delivery, with any pregnancy- or postpartum-related emergencies, if you think you are in labor, or if your water has broken. For example, if you experience nausea and vomiting, vaginal bleeding, severe abdominal or pelvic pain, elevated blood pressure or other problems related to your pregnancy or postpartum time, please come to the Maternity Assessment Unit for assistance.        First Trimester of Pregnancy The first trimester of pregnancy is from week 1 until the end of week 13 (months 1 through 3). A week after a sperm fertilizes an egg, the egg will implant on the wall of the uterus. This embryo will begin to develop into a baby. Genes from you and your partner will form the baby. The female genes will determine whether the baby will be a boy or a girl. At 6-8 weeks, the eyes and face will be formed, and the heartbeat can be seen on ultrasound. At the end of 12 weeks, all the baby's organs will be formed. Now that you are pregnant, you will want to do everything you can to have a healthy baby. Two of the most important things are to get good prenatal care and to follow your health care provider's instructions. Prenatal care is all the medical care you receive before the baby's birth. This care will help prevent, find, and treat any problems during the pregnancy and childbirth. Body changes during your first trimester Your body goes through many changes during pregnancy. The changes vary from woman to woman.  You may gain or lose a couple of pounds at first.  You may feel sick to your stomach (nauseous) and you may throw up  (vomit). If the vomiting is uncontrollable, call your health care provider.  You may tire easily.  You may develop headaches that can be relieved by medicines. All medicines should be approved by your health care provider.  You may urinate more often. Painful urination may mean you have a bladder infection.  You may develop heartburn as a result of your pregnancy.  You may develop constipation because certain hormones are causing the muscles that push stool through your intestines to slow down.  You may develop hemorrhoids or swollen veins (varicose veins).  Your breasts may begin to grow larger and become tender. Your nipples may stick out more, and the tissue that surrounds them (areola) may become darker.  Your gums may bleed and may be sensitive to brushing and flossing.  Dark spots or blotches (chloasma, mask of pregnancy) may develop on your face. This will likely fade after the baby is born.  Your menstrual periods will stop.  You may have a loss of appetite.  You may develop cravings for certain kinds of food.  You may have changes in your emotions from day to day, such as being excited to be pregnant or being concerned that something may go wrong with the pregnancy and baby.  You may have more vivid and strange dreams.  You may have changes in your hair. These can include thickening of your hair, rapid growth, and changes in texture. Some women also have hair loss during or after pregnancy,  or hair that feels dry or thin. Your hair will most likely return to normal after your baby is born. What to expect at prenatal visits During a routine prenatal visit:  You will be weighed to make sure you and the baby are growing normally.  Your blood pressure will be taken.  Your abdomen will be measured to track your baby's growth.  The fetal heartbeat will be listened to between weeks 10 and 14 of your pregnancy.  Test results from any previous visits will be discussed. Your  health care provider may ask you:  How you are feeling.  If you are feeling the baby move.  If you have had any abnormal symptoms, such as leaking fluid, bleeding, severe headaches, or abdominal cramping.  If you are using any tobacco products, including cigarettes, chewing tobacco, and electronic cigarettes.  If you have any questions. Other tests that may be performed during your first trimester include:  Blood tests to find your blood type and to check for the presence of any previous infections. The tests will also be used to check for low iron levels (anemia) and protein on red blood cells (Rh antibodies). Depending on your risk factors, or if you previously had diabetes during pregnancy, you may have tests to check for high blood sugar that affects pregnant women (gestational diabetes).  Urine tests to check for infections, diabetes, or protein in the urine.  An ultrasound to confirm the proper growth and development of the baby.  Fetal screens for spinal cord problems (spina bifida) and Down syndrome.  HIV (human immunodeficiency virus) testing. Routine prenatal testing includes screening for HIV, unless you choose not to have this test.  You may need other tests to make sure you and the baby are doing well. Follow these instructions at home: Medicines  Follow your health care provider's instructions regarding medicine use. Specific medicines may be either safe or unsafe to take during pregnancy.  Take a prenatal vitamin that contains at least 600 micrograms (mcg) of folic acid.  If you develop constipation, try taking a stool softener if your health care provider approves. Eating and drinking   Eat a balanced diet that includes fresh fruits and vegetables, whole grains, good sources of protein such as meat, eggs, or tofu, and low-fat dairy. Your health care provider will help you determine the amount of weight gain that is right for you.  Avoid raw meat and uncooked  cheese. These carry germs that can cause birth defects in the baby.  Eating four or five small meals rather than three large meals a day may help relieve nausea and vomiting. If you start to feel nauseous, eating a few soda crackers can be helpful. Drinking liquids between meals, instead of during meals, also seems to help ease nausea and vomiting.  Limit foods that are high in fat and processed sugars, such as fried and sweet foods.  To prevent constipation: ? Eat foods that are high in fiber, such as fresh fruits and vegetables, whole grains, and beans. ? Drink enough fluid to keep your urine clear or pale yellow. Activity  Exercise only as directed by your health care provider. Most women can continue their usual exercise routine during pregnancy. Try to exercise for 30 minutes at least 5 days a week. Exercising will help you: ? Control your weight. ? Stay in shape. ? Be prepared for labor and delivery.  Experiencing pain or cramping in the lower abdomen or lower back is a good sign that  you should stop exercising. Check with your health care provider before continuing with normal exercises.  Try to avoid standing for long periods of time. Move your legs often if you must stand in one place for a long time.  Avoid heavy lifting.  Wear low-heeled shoes and practice good posture.  You may continue to have sex unless your health care provider tells you not to. Relieving pain and discomfort  Wear a good support bra to relieve breast tenderness.  Take warm sitz baths to soothe any pain or discomfort caused by hemorrhoids. Use hemorrhoid cream if your health care provider approves.  Rest with your legs elevated if you have leg cramps or low back pain.  If you develop varicose veins in your legs, wear support hose. Elevate your feet for 15 minutes, 3-4 times a day. Limit salt in your diet. Prenatal care  Schedule your prenatal visits by the twelfth week of pregnancy. They are usually  scheduled monthly at first, then more often in the last 2 months before delivery.  Write down your questions. Take them to your prenatal visits.  Keep all your prenatal visits as told by your health care provider. This is important. Safety  Wear your seat belt at all times when driving.  Make a list of emergency phone numbers, including numbers for family, friends, the hospital, and police and fire departments. General instructions  Ask your health care provider for a referral to a local prenatal education class. Begin classes no later than the beginning of month 6 of your pregnancy.  Ask for help if you have counseling or nutritional needs during pregnancy. Your health care provider can offer advice or refer you to specialists for help with various needs.  Do not use hot tubs, steam rooms, or saunas.  Do not douche or use tampons or scented sanitary pads.  Do not cross your legs for long periods of time.  Avoid cat litter boxes and soil used by cats. These carry germs that can cause birth defects in the baby and possibly loss of the fetus by miscarriage or stillbirth.  Avoid all smoking, herbs, alcohol, and medicines not prescribed by your health care provider. Chemicals in these products affect the formation and growth of the baby.  Do not use any products that contain nicotine or tobacco, such as cigarettes and e-cigarettes. If you need help quitting, ask your health care provider. You may receive counseling support and other resources to help you quit.  Schedule a dentist appointment. At home, brush your teeth with a soft toothbrush and be gentle when you floss. Contact a health care provider if:  You have dizziness.  You have mild pelvic cramps, pelvic pressure, or nagging pain in the abdominal area.  You have persistent nausea, vomiting, or diarrhea.  You have a bad smelling vaginal discharge.  You have pain when you urinate.  You notice increased swelling in your face,  hands, legs, or ankles.  You are exposed to fifth disease or chickenpox.  You are exposed to [place] measles (rubella) and have never had it. Get help right away if:  You have a fever.  You are leaking fluid from your vagina.  You have spotting or bleeding from your vagina.  You have severe abdominal cramping or pain.  You have rapid weight gain or loss.  You vomit blood or material that looks like coffee grounds.  You develop a severe headache.  You have shortness of breath.  You have any kind of trauma,  such as from a fall or a car accident. Summary  The first trimester of pregnancy is from week 1 until the end of week 13 (months 1 through 3).  Your body goes through many changes during pregnancy. The changes vary from woman to woman.  You will have routine prenatal visits. During those visits, your health care provider will examine you, discuss any test results you may have, and talk with you about how you are feeling. This information is not intended to replace advice given to you by your health care provider. Make sure you discuss any questions you have with your health care provider. Document Revised: 05/26/2017 Document Reviewed: 05/25/2016 Elsevier Patient Education  [place] of Pregnancy The second trimester is from week 14 through week 27 (months 4 through 6). The second trimester is often a time when you feel your best. Your body has adjusted to being pregnant, and you begin to feel better physically. Usually, morning sickness has lessened or quit completely, you may have more energy, and you may have an increase in appetite. The second trimester is also a time when the fetus is growing rapidly. At the end of the sixth month, the fetus is about 9 inches long and weighs about 1 pounds. You will likely begin to feel the baby move (quickening) between 16 and 20 weeks of pregnancy. Body changes during your second trimester Your body  continues to go through many changes during your second trimester. The changes vary from woman to woman.  Your weight will continue to increase. You will notice your lower abdomen bulging out.  You may begin to get stretch marks on your hips, abdomen, and breasts.  You may develop headaches that can be relieved by medicines. The medicines should be approved by your health care provider.  You may urinate more often because the fetus is pressing on your bladder.  You may develop or continue to have heartburn as a result of your pregnancy.  You may develop constipation because certain hormones are causing the muscles that push waste through your intestines to slow down.  You may develop hemorrhoids or swollen, bulging veins (varicose veins).  You may have back pain. This is caused by: ? Weight gain. ? Pregnancy hormones that are relaxing the joints in your pelvis. ? A shift in weight and the muscles that support your balance.  Your breasts will continue to grow and they will continue to become tender.  Your gums may bleed and may be sensitive to brushing and flossing.  Dark spots or blotches (chloasma, mask of pregnancy) may develop on your face. This will likely fade after the baby is born.  A dark line from your belly button to the pubic area (linea nigra) may appear. This will likely fade after the baby is born.  You may have changes in your hair. These can include thickening of your hair, rapid growth, and changes in texture. Some women also have hair loss during or after pregnancy, or hair that feels dry or thin. Your hair will most likely return to normal after your baby is born. What to expect at prenatal visits During a routine prenatal visit:  You will be weighed to make sure you and the fetus are growing normally.  Your blood pressure will be taken.  Your abdomen will be measured to track your baby's growth.  The fetal heartbeat will be listened to.  Any test results  from the previous  visit will be discussed. Your health care provider may ask you:  How you are feeling.  If you are feeling the baby move.  If you have had any abnormal symptoms, such as leaking fluid, bleeding, severe headaches, or abdominal cramping.  If you are using any tobacco products, including cigarettes, chewing tobacco, and electronic cigarettes.  If you have any questions. Other tests that may be performed during your second trimester include:  Blood tests that check for: ? Low iron levels (anemia). ? High blood sugar that affects pregnant women (gestational diabetes) between 60 and 28 weeks. ? Rh antibodies. This is to check for a protein on red blood cells (Rh factor).  Urine tests to check for infections, diabetes, or protein in the urine.  An ultrasound to confirm the proper growth and development of the baby.  An amniocentesis to check for possible genetic problems.  Fetal screens for spina bifida and Down syndrome.  HIV (human immunodeficiency virus) testing. Routine prenatal testing includes screening for HIV, unless you choose not to have this test. Follow these instructions at home: Medicines  Follow your health care provider's instructions regarding medicine use. Specific medicines may be either safe or unsafe to take during pregnancy.  Take a prenatal vitamin that contains at least 600 micrograms (mcg) of folic acid.  If you develop constipation, try taking a stool softener if your health care provider approves. Eating and drinking   Eat a balanced diet that includes fresh fruits and vegetables, whole grains, good sources of protein such as meat, eggs, or tofu, and low-fat dairy. Your health care provider will help you determine the amount of weight gain that is right for you.  Avoid raw meat and uncooked cheese. These carry germs that can cause birth defects in the baby.  If you have low calcium intake from food, talk to your health care provider  about whether you should take a daily calcium supplement.  Limit foods that are high in fat and processed sugars, such as fried and sweet foods.  To prevent constipation: ? Drink enough fluid to keep your urine clear or pale yellow. ? Eat foods that are high in fiber, such as fresh fruits and vegetables, whole grains, and beans. Activity  Exercise only as directed by your health care provider. Most women can continue their usual exercise routine during pregnancy. Try to exercise for 30 minutes at least 5 days a week. Stop exercising if you experience uterine contractions.  Avoid heavy lifting, wear low heel shoes, and practice good posture.  A sexual relationship may be continued unless your health care provider directs you otherwise. Relieving pain and discomfort  Wear a good support bra to prevent discomfort from breast tenderness.  Take warm sitz baths to soothe any pain or discomfort caused by hemorrhoids. Use hemorrhoid cream if your health care provider approves.  Rest with your legs elevated if you have leg cramps or low back pain.  If you develop varicose veins, wear support hose. Elevate your feet for 15 minutes, 3-4 times a day. Limit salt in your diet. Prenatal Care  Write down your questions. Take them to your prenatal visits.  Keep all your prenatal visits as told by your health care provider. This is important. Safety  Wear your seat belt at all times when driving.  Make a list of emergency phone numbers, including numbers for family, friends, the hospital, and police and fire departments. General instructions  Ask your health care provider for a referral to a  local prenatal education class. Begin classes no later than the beginning of month 6 of your pregnancy.  Ask for help if you have counseling or nutritional needs during pregnancy. Your health care provider can offer advice or refer you to specialists for help with various needs.  Do not use hot tubs, steam  rooms, or saunas.  Do not douche or use tampons or scented sanitary pads.  Do not cross your legs for long periods of time.  Avoid cat litter boxes and soil used by cats. These carry germs that can cause birth defects in the baby and possibly loss of the fetus by miscarriage or stillbirth.  Avoid all smoking, herbs, alcohol, and unprescribed drugs. Chemicals in these products can affect the formation and growth of the baby.  Do not use any products that contain nicotine or tobacco, such as cigarettes and e-cigarettes. If you need help quitting, ask your health care provider.  Visit your dentist if you have not gone yet during your pregnancy. Use a soft toothbrush to brush your teeth and be gentle when you floss. Contact a health care provider if:  You have dizziness.  You have mild pelvic cramps, pelvic pressure, or nagging pain in the abdominal area.  You have persistent nausea, vomiting, or diarrhea.  You have a bad smelling vaginal discharge.  You have pain when you urinate. Get help right away if:  You have a fever.  You are leaking fluid from your vagina.  You have spotting or bleeding from your vagina.  You have severe abdominal cramping or pain.  You have rapid weight gain or weight loss.  You have shortness of breath with chest pain.  You notice sudden or extreme swelling of your face, hands, ankles, feet, or legs.  You have not felt your baby move in over an hour.  You have severe headaches that do not go away when you take medicine.  You have vision changes. Summary  The second trimester is from week 14 through week 27 (months 4 through 6). It is also a time when the fetus is growing rapidly.  Your body goes through many changes during pregnancy. The changes vary from woman to woman.  Avoid all smoking, herbs, alcohol, and unprescribed drugs. These chemicals affect the formation and growth your baby.  Do not use any tobacco products, such as cigarettes,  chewing tobacco, and e-cigarettes. If you need help quitting, ask your health care provider.  Contact your health care provider if you have any questions. Keep all prenatal visits as told by your health care provider. This is important. This information is not intended to replace advice given to you by your health care provider. Make sure you discuss any questions you have with your health care provider. Document Revised: 10/05/2018 Document Reviewed: 07/19/2016 Elsevier Patient Education  [place].         Round Ligament Pain  The round ligament is a cord of muscle and tissue that helps support the uterus. It can become a source of pain during pregnancy if it becomes stretched or twisted as the baby grows. The pain usually begins in the second trimester (13-28 weeks) of pregnancy, and it can come and go until the baby is delivered. It is not a serious problem, and it does not cause harm to the baby. Round ligament pain is usually a short, sharp, and pinching pain, but it can also be a dull, lingering, and aching pain. The pain is felt in the lower side  of the abdomen or in the groin. It usually starts deep in the groin and moves up to the outside of the hip area. The pain may occur when you:  Suddenly change position, such as quickly going from a sitting to standing position.  Roll over in bed.  Cough or sneeze.  Do physical activity. Follow these instructions at home:   Watch your condition for any changes.  When the pain starts, relax. Then try any of these methods to help with the pain: ? Sitting down. ? Flexing your knees up to your abdomen. ? Lying on your side with one pillow under your abdomen and another pillow between your legs. ? Sitting in a warm bath for 15-20 minutes or until the pain goes away.  Take over-the-counter and prescription medicines only as told by your health care provider.  Move slowly when you sit down or stand up.  Avoid long walks if  they cause pain.  Stop or reduce your physical activities if they cause pain.  Keep all follow-up visits as told by your health care provider. This is important. Contact a health care provider if:  Your pain does not go away with treatment.  You feel pain in your back that you did not have before.  Your medicine is not helping. Get help right away if:  You have a fever or chills.  You develop uterine contractions.  You have vaginal bleeding.  You have nausea or vomiting.  You have diarrhea.  You have pain when you urinate. Summary  Round ligament pain is felt in the lower abdomen or groin. It is usually a short, sharp, and pinching pain. It can also be a dull, lingering, and aching pain.  This pain usually begins in the second trimester (13-28 weeks). It occurs because the uterus is stretching with the growing baby, and it is not harmful to the baby.  You may notice the pain when you suddenly change position, when you cough or sneeze, or during physical activity.  Relaxing, flexing your knees to your abdomen, lying on one side, or taking a warm bath may help to get rid of the pain.  Get help from your health care provider if the pain does not go away or if you have vaginal bleeding, nausea, vomiting, diarrhea, or painful urination. This information is not intended to replace advice given to you by your health care provider. Make sure you discuss any questions you have with your health care provider. Document Revised: 11/29/2017 Document Reviewed: 11/29/2017 Elsevier Patient Education  [place].        Tests and Screening During Pregnancy Having certain tests and screenings during pregnancy is an important part of your prenatal care. These tests help your health care provider find problems that might affect your pregnancy. Some tests are done for all pregnant women, and some are optional. Most of the tests and screenings do not pose any risks for you or your  baby. You may need additional testing if any routine tests indicate a problem. Tests and screenings done in early pregnancy Some tests and screenings you can expect to have in early pregnancy include:  Blood tests, such as: ? Complete blood count (CBC). This test is done to check your red and white blood cells. It can help identify a risk for anemia, infection, or bleeding. ? Blood typing. This test determines your blood type as well as whether you have a certain protein in your red blood cells (Rh factor). If you  do not have this protein (Rh negative) and your baby does have it (Rh positive), your body could make antibodies to the Rh factor. This could be dangerous to your baby's health. ? Tests to check for diseases that can cause birth defects or can be passed to your baby, such as:  [place] measles (rubella). The test indicates whether you are immune to rubella.  Hepatitis B and C. All women are tested for hepatitis B. You may also be tested for hepatitis C if you have risk factors for the condition.  Zika virus infection. You may have a blood or urine test to check for this infection if you or your partner has traveled to an area where the virus occurs.  Urine testing. A urine sample can be tested for diabetes, protein in your urine, and signs of infection.  Testing for sexually transmitted infections (STIs), such as HIV, syphilis, and chlamydia.  Testing for tuberculosis. You may have this skin test if you are at risk for tuberculosis.  Fetal ultrasound. This is an imaging study of your developing baby. It is done using sound waves and a computer. This test may be done at 11-14 weeks to confirm your pregnancy and help determine your due date. Tests and screenings done later in pregnancy Certain tests are done for the first time during later pregnancy. In addition, some of the tests that were done in early pregnancy are repeated at this time. Some common tests you can expect to have later  in pregnancy include:  Rh antibody testing. If you are Rh negative, you will have a blood test at about 28 weeks of pregnancy to see if you are producing Rh antibodies. If you have not started to make antibodies, you will be given an injection to prevent you from making antibodies for the rest of your pregnancy.  Glucose screening. This tests your blood sugar to find out whether you are developing the type of diabetes that occurs during pregnancy (gestational diabetes). You may have this screening earlier if you have risk factors for diabetes.  Screening for group B streptococcus (GBS). GBS is a type of bacteria that may live in your rectum or vagina. You may have GBS without any symptoms. GBS can spread to your baby during birth. This test involves doing a rectal and vaginal swab at 35-37 weeks of pregnancy. If testing is positive for GBS, you may be treated with antibiotic medicine.  CBC to check for anemia and blood-clotting ability.  Urine tests to check for protein, which can be a sign of a condition called preeclampsia.  Fetal ultrasound. This may be repeated at 16-20 weeks to check how your baby is growing and developing. Screening for birth defects Some birth defects are caused by abnormal genes passed down through families. Early in your pregnancy, tests can be done to find out if your baby is at risk for a genetic disorder. This testing is optional. The type of testing recommended for you will depend on your family and medical history, your ethnicity, and your age. Testing may include:  Screening tests. These tests may include an ultrasound, blood tests, or a combination of both. The blood tests are used to check for abnormal genes, and the ultrasound is done to look for early birth defects.  Carrier screening. This test involves checking the blood or saliva of both parents to see if they carry abnormal genes that could be passed down to a baby. If genetic screening shows that your baby  is  at risk for a genetic defect, additional diagnostic testing may be recommended, such as:  Amniocentesis. This involves testing a sample of fluid from your womb (amniotic fluid).  Chorionic villus sampling. In this test, a sample of cells from your placenta is checked for abnormal cells. Unlike other tests done during pregnancy, diagnostic testing does have some risk for your pregnancy. Talk to your health care provider about the risks and benefits of genetic testing. Where to find more information  American Pregnancy Association: americanpregnancy.org/prenatal-testing  Office on Women's Health: KeywordPortfolios.com.br  March of Dimes: marchofdimes.org/pregnancy Questions to ask your health care provider  What routine tests are recommended for me?  When and how will these tests be done?  When will I get the results of routine tests?  What do the results of these tests mean for me or my baby?  Do you recommend any genetic screening tests? Which ones?  Should I see a genetic counselor before having genetic screening? Summary  Having tests and screenings during pregnancy is an important part of your prenatal care.  In early pregnancy, testing may be done to check blood type, Rh status, and risks for various conditions that can affect your baby.  Fetal ultrasound may be done in early pregnancy to confirm a pregnancy and later to look for any birth defects.  Later in pregnancy, tests may include screening for GBS and gestational diabetes.  Genetic testing is optional. Consider talking to a genetic counselor about this testing. This information is not intended to replace advice given to you by your health care provider. Make sure you discuss any questions you have with your health care provider. Document Revised: 10/03/2018 Document Reviewed: 08/28/2017 Elsevier Patient Education  [place].        Pregnancy and Genital Herpes  Genital herpes is an STI  (sexually transmitted infection) that is caused by the herpes simplex virus (HSV). HSV can cause an outbreak of itching, blisters, and sores (ulcers) around the genitals and rectum. Even when the outbreak goes away, the virus stays in the body. If you are pregnant, you can pass HSV to your baby. If you become infected with HSV for the first time while you are pregnant, the virus can cause serious problems for your baby. If you had HSV before your pregnancy, the virus may not affect your baby as seriously. Babies that are infected with HSV are at risk for developing inflammation of the brain (encephalitis), damage to organs, and problems with development. How does this affect me? Your type of delivery may be affected. You may be able to have a vaginal delivery if you have no evidence of an outbreak when you go into labor. However, your baby may need to be delivered by C-section (cesarean delivery) if you have:  An active, recurrent, or new herpes outbreak at the time of delivery. This is because the virus can pass to your baby through an infected birth canal. This can cause severe problems for your baby.  Any symptoms of infection in the areas around the genitals such as pain, burning, and itching, even if you do not have any ulcers in the birth canal. After delivery, you can breastfeed your baby. The virus will not be present in breast milk. While caring for your baby, you will need to take steps to avoid passing the virus on to your baby. How does this affect my baby? If the virus passes to your baby, it can cause serious problems. The virus can be passed  to your baby:  Before delivery. The virus can be passed to your unborn baby through the placenta. This is more likely to happen if you get herpes for the first time in the first 3 months of pregnancy (first trimester). This may cause your baby to have a congenital disability.  During delivery. This is more likely to happen if you become infected for  the first time late in your pregnancy.  After delivery. Your baby can get a herpes infection if you touch active ulcers and then touch your baby without washing your hands. The virus is less likely to pass to your baby if you had herpes before you became pregnant. This is because antibodies against the virus develop over a period of time. These antibodies help to protect the baby. How is this treated? This condition can be treated with medicines during pregnancy that are safe for you and your baby. These medicines can help to reduce symptoms, shorten an outbreak, and prevent another outbreak of the infection. If the infection happened before you became pregnant, you may need to take medicine late in your pregnancy to help to prevent a breakout at the time of delivery. Follow these instructions at home: To avoid passing the virus to your baby:  Wash your hands with soap and water often and before touching your baby.  If you have an outbreak, keep the area clean and covered.  If ulcers are present on your breast, do not breastfeed from the affected breast. Contact a health care provider if:  You have a rash, blisters, or ulcers in the area around your genitals or rectum.  You have burning, itching, or pain in the area around your genitals or rectum.  You have trouble urinating. Summary  Genital herpes is an STI (sexually transmitted infection) that is caused by the herpes simplex virus (HSV). If you are pregnant, you can pass the virus to your baby.  Even when the outbreak goes away, the virus stays in your body.  Genital herpes can be passed to your unborn or newborn baby and cause serious problems.  This condition can be treated with medicines during pregnancy that are safe for you and your baby. Medicines can treat your symptoms, shorten the length of an outbreak, and prevent another outbreak of the infection.  If you have signs or symptoms of a herpes outbreak when you go into labor,  your health care provider may recommend a C-section (cesarean delivery) to lower the risk of passing the virus to your baby. This information is not intended to replace advice given to you by your health care provider. Make sure you discuss any questions you have with your health care provider. Document Revised: 10/05/2018 Document Reviewed: 09/13/2018 Elsevier Patient Education  [place] During Pregnancy Genetic testing during pregnancy is also called prenatal genetic testing. This type of testing can determine if your baby is at risk of being born with a disorder caused by abnormal genes or chromosomes (genetic disorder). Chromosomes contain genes that control how your baby will develop in your womb. There are many different genetic disorders. Examples of genetic disorders that may be found through genetic testing include Down syndrome and cystic fibrosis. Gene changes (mutations) can be passed down through families. Genetic testing is offered to all women before or during pregnancy. You can choose whether to have genetic testing. Why is genetic testing done? Genetic testing is done during pregnancy to find out whether  your child is at risk for a genetic disorder. Having genetic testing allows you to:  Discuss your test results and options with a genetic counselor.  Prepare for a baby that may be born with a genetic disorder. Learning about the disorder ahead of time helps you be better prepared to manage it. Your health care providers can also be prepared in case your baby requires special care before or after birth.  Consider whether you want to continue with the pregnancy. In some cases, genetic testing may be done to learn about the traits a child will inherit. Types of genetic tests There are two basic types of genetic testing. Screening tests indicate whether your developing baby (fetus) is at higher risk for a genetic disorder. Diagnostic tests check  actual fetal cells to diagnose a genetic disorder. Screening tests     Screening tests will not harm your baby. They are recommended for all pregnant women. Types of screening tests include:  Carrier screening. This test involves checking genes from both parents by testing their blood or saliva. The test checks to find out if the parents carry a genetic mutation that may be passed to a baby. In most cases, both parents must carry the mutation for a baby to be at risk.  First trimester screening. This test combines a blood test with sound wave imaging of your baby (fetal ultrasound). This screening test checks for a risk of Down syndrome or other defects caused by having extra chromosomes. It also checks for defects of the heart, abdomen, or skeleton.  Second trimester screening also combines a blood test with a fetal ultrasound exam. It checks for a risk of genetic defects of the face, brain, spine, heart, or limbs.  Combined or sequential screening. This type of testing combines the results of first and second trimester screening. This type of testing may be more accurate than first or second trimester screening alone.  Cell-free DNA testing. This is a blood test that detects cells released by the placenta that get into the mother's blood. It can be used to check for a risk of Down syndrome, other extra chromosome syndromes, and disorders caused by abnormal numbers of sex chromosomes. This test can be done any time after 10 weeks of pregnancy.  Diagnostic tests Diagnostic tests carry slight risks of problems, including bleeding, infection, and loss of the pregnancy. These tests are done only if your baby is at risk for a genetic disorder. You may meet with a genetic counselor to discuss the risks and benefits before having diagnostic tests. Examples of diagnostic tests include:  Chorionic villus sampling (CVS). This involves a procedure to remove and test a sample of cells taken from the  placenta. The procedure may be done between 10 and 12 weeks of pregnancy.  Amniocentesis. This involves a procedure to remove and test a sample of fluid (amniotic fluid) and cells from the sac that surrounds the developing baby. The procedure may be done between 15 and 20 weeks of pregnancy. What do the results mean? For a screening test:  If the results are negative, it often means that your child is not at higher risk. There is still a slight chance your child could have a genetic disorder.  If the results are positive, it does not mean your child will have a genetic disorder. It may mean that your child has a higher-than-normal risk for a genetic disorder. In that case, you may want to talk with a genetic counselor about whether you  should have diagnostic genetic tests. For a diagnostic test:  If the result is negative, it is unlikely that your child will have a genetic disorder.  If the test is positive for a genetic disorder, it is likely that your child will have the disorder. The test may not tell how severe the disorder will be. Talk with your health care provider about your options. Questions to ask your health care provider Before talking to your health care provider about genetic testing, find out if there is a history of genetic disorders in your family. It may also help to know your family's ethnic origins. Then ask your health care provider the following questions:  Is my baby at risk for a genetic disorder?  What are the benefits of having genetic screening?  What tests are best for me and my baby?  What are the risks of each test?  If I get a positive result on a screening test, what is the next step?  Should I meet with a genetic counselor before having a diagnostic test?  Should my partner or other members of my family be tested?  How much do the tests cost? Will my insurance cover the testing? Summary  Genetic testing is done during pregnancy to find out whether  your child is at risk for a genetic disorder.  Genetic testing is offered to all women before or during pregnancy. You can choose whether to have genetic testing.  There are two basic types of genetic testing. Screening tests indicate whether your developing baby (fetus) is at higher risk for a genetic disorder. Diagnostic tests check actual fetal cells to diagnose a genetic disorder.  If a diagnostic genetic test is positive, talk with your health care provider about your options. This information is not intended to replace advice given to you by your health care provider. Make sure you discuss any questions you have with your health care provider. Document Revised: 10/04/2018 Document Reviewed: 08/28/2017 Elsevier Patient Education  [place].

## 2020-01-03 DIAGNOSIS — O24313 Unspecified pre-existing diabetes mellitus in pregnancy, third trimester: Secondary | ICD-10-CM | POA: Insufficient documentation

## 2020-01-03 DIAGNOSIS — O24312 Unspecified pre-existing diabetes mellitus in pregnancy, second trimester: Secondary | ICD-10-CM | POA: Insufficient documentation

## 2020-01-03 LAB — CERVICOVAGINAL ANCILLARY ONLY
Bacterial Vaginitis (gardnerella): POSITIVE — AB
Candida Glabrata: POSITIVE — AB
Candida Vaginitis: POSITIVE — AB
Chlamydia: NEGATIVE
Comment: NEGATIVE
Comment: NEGATIVE
Comment: NEGATIVE
Comment: NEGATIVE
Comment: NEGATIVE
Comment: NORMAL
Neisseria Gonorrhea: NEGATIVE
Trichomonas: NEGATIVE

## 2020-01-03 LAB — CBC/D/PLT+RPR+RH+ABO+RUB AB...
Antibody Screen: NEGATIVE
Basophils Absolute: 0 10*3/uL (ref 0.0–0.2)
Basos: 0 %
EOS (ABSOLUTE): 0.1 10*3/uL (ref 0.0–0.4)
Eos: 1 %
HCV Ab: <0.1 s/co ratio (ref 0.0–0.9)
HIV Screen 4th Generation wRfx: NONREACTIVE
Hematocrit: 35.4 % (ref 34.0–46.6)
Hemoglobin: 12.2 g/dL (ref 11.1–15.9)
Hepatitis B Surface Ag: NEGATIVE
Immature Grans (Abs): 0 10*3/uL (ref 0.0–0.1)
Immature Granulocytes: 0 %
Lymphocytes Absolute: 1.7 10*3/uL (ref 0.7–3.1)
Lymphs: 27 %
MCH: 31 pg (ref 26.6–33.0)
MCHC: 34.5 g/dL (ref 31.5–35.7)
MCV: 90 fL (ref 79–97)
Monocytes Absolute: 0.5 10*3/uL (ref 0.1–0.9)
Monocytes: 7 %
Neutrophils Absolute: 4.2 10*3/uL (ref 1.4–7.0)
Neutrophils: 65 %
Platelets: 252 10*3/uL (ref 150–450)
RBC: 3.94 x10E6/uL (ref 3.77–5.28)
RDW: 12.4 % (ref 11.7–15.4)
RPR Ser Ql: NONREACTIVE
Rh Factor: POSITIVE
Rubella Antibodies, IGG: 1 index (ref >0.99)
WBC: 6.5 10*3/uL (ref 3.4–10.8)

## 2020-01-03 LAB — HCV INTERPRETATION

## 2020-01-03 LAB — HEMOGLOBIN A1C
Est. average glucose Bld gHb Est-mCnc: 232 mg/dL
Hgb A1c MFr Bld: 9.7 % — ABNORMAL HIGH (ref 4.8–5.6)

## 2020-01-06 ENCOUNTER — Encounter: Payer: Self-pay | Admitting: Women's Health

## 2020-01-06 DIAGNOSIS — R8271 Bacteriuria: Secondary | ICD-10-CM | POA: Insufficient documentation

## 2020-01-06 LAB — URINE CULTURE, OB REFLEX

## 2020-01-06 LAB — CULTURE, OB URINE

## 2020-01-07 ENCOUNTER — Other Ambulatory Visit: Payer: Self-pay

## 2020-01-07 ENCOUNTER — Telehealth: Payer: Self-pay

## 2020-01-07 DIAGNOSIS — O24311 Unspecified pre-existing diabetes mellitus in pregnancy, first trimester: Secondary | ICD-10-CM

## 2020-01-07 MED ORDER — ACCU-CHEK GUIDE W/DEVICE KIT: 1.0000 | PACK | Freq: Four times a day (QID) | 0 refills | Status: DC

## 2020-01-07 MED ORDER — ACCU-CHEK GUIDE VI STRP: ORAL_STRIP | 12 refills | Status: DC

## 2020-01-07 MED ORDER — ACCU-CHEK SOFTCLIX LANCETS MISC: 1.0000 | Freq: Four times a day (QID) | 12 refills | Status: DC

## 2020-01-07 NOTE — Telephone Encounter (Signed)
TC to pt to make aware of results and confirm PCN reaction per Elmyra Ricks Nugent pt not ava lmovm.

## 2020-01-08 ENCOUNTER — Encounter: Payer: Self-pay | Admitting: Women's Health

## 2020-01-09 ENCOUNTER — Encounter: Payer: Self-pay | Admitting: Women's Health

## 2020-01-09 ENCOUNTER — Other Ambulatory Visit: Payer: Self-pay | Admitting: Women's Health

## 2020-01-09 ENCOUNTER — Telehealth: Payer: Self-pay

## 2020-01-09 DIAGNOSIS — R8271 Bacteriuria: Secondary | ICD-10-CM

## 2020-01-09 MED ORDER — AMOXICILLIN 500 MG PO CAPS: 500.0000 mg | ORAL_CAPSULE | Freq: Three times a day (TID) | ORAL | 0 refills | Status: AC

## 2020-01-09 NOTE — Progress Notes (Signed)
RX AMOX for GBS+ UTI. Patient chart originally stated allergy to PCN, but on clarification pt states father is allergic to PCN and she has use AMOX in the past without concern.  Clarisa Fling, NP  11:52 AM 01/09/2020

## 2020-01-09 NOTE — Telephone Encounter (Signed)
-----   Message from Clarisa Fling, NP sent at 01/09/2020 11:54 AM EDT ----- Regarding: RE: GBS+ in Urine + PCN Allergy Thank you for this information. Patient has GBS UTI. I sent amoxicillin to her pharmacy for her. Please call her to let her know to pick it up and start it today. Thank you, Elmyra Ricks ----- Message ----- From: Courtney Heys, CMA Sent: 01/07/2020   2:03 PM EDT To: Clarisa Fling, NP Subject: RE: GBS+ in Urine + PCN Allergy                Patient states her father is allergic to PCN. She has never took Keflex before. She has taken Amoxicillin before in the past.  ----- Message ----- From: Clarisa Fling, NP Sent: 01/06/2020  11:12 AM EDT To: [facility] Subject: GBS+ in Urine + PCN Allergy                    Hello - this patient tested positive for GBS in her urine. She also noted an allergy to PCN. Please call to inquire what her reaction is to PCN when she takes it. Please also ask if she has ever taken Keflex in the past without issue. Patient does not have MyChart so will need to be called. Please let me know what she says. Thank you! Elmyra Ricks

## 2020-01-09 NOTE — Telephone Encounter (Signed)
TC to pt to make aware Rx sent for GBS in urine [facility] sent amoxicillin.  No answer went directly to vm I left a detailed message regarding Rx.

## 2020-01-15 ENCOUNTER — Encounter: Payer: Medicaid Other | Admitting: Obstetrics and Gynecology

## 2020-01-16 ENCOUNTER — Other Ambulatory Visit: Payer: Medicaid Other

## 2020-01-28 ENCOUNTER — Ambulatory Visit: Payer: Medicaid Other | Admitting: Registered"

## 2020-01-28 ENCOUNTER — Other Ambulatory Visit: Payer: Self-pay

## 2020-01-28 DIAGNOSIS — O24311 Unspecified pre-existing diabetes mellitus in pregnancy, first trimester: Secondary | ICD-10-CM

## 2020-01-28 DIAGNOSIS — M25559 Pain in unspecified hip: Secondary | ICD-10-CM

## 2020-01-28 NOTE — Progress Notes (Signed)
Patient was seen on 01/28/20 for Type 2 Diabetes in pregnancy self-management. A1c 9.7% EDD 07/22/20  RD discussed insulin options including Omnipod and the need for her to provide blood sugar readings for MD to make decision whether or not it is needed.  History of T2DM diagnosis: Patient states she was diagnosed with Type 2 diabetes in 2015, the year her father passed away. Pt states at diagnosis took Metformin x2 months but discontinued because she was told her blood sugar was fine at that point and thought she didn't have to worry about it anymore. Pt states metformin caused extreme stomach pain and nausea and also did not like taste it left in her mouth.  Diet: Pt reports poor appetite but has improved some since morning sickness has resolved.  Patient first meal of day is during break at work ~2 pm, eats ready made food from [employer] close to work. Beverages includes "a lot of water", 2 c juice, may have regular soda or lemonade with a meal.     Physical Activity: Pt states she has hip pain that makes walking and sleeping on left side painful. Referral order placed for rehab.  The following learning objectives were met by the patient :   States the difference of Gestational Diabetes and pre-existing diabetes in pregnancy  States why dietary management is important in controlling blood glucose  Describes the effects of carbohydrates on blood glucose levels  Demonstrates ability to create a balanced meal plan  Demonstrates carbohydrate counting   States when to check blood glucose levels  Demonstrates proper blood glucose monitoring techniques  States the effect of stress and exercise on blood glucose levels  States the importance of limiting caffeine and abstaining from alcohol and smoking  Plan:  Aim for 3 Carbohydrate Choices per meal (45 grams) +/- 1 either way  Aim for 1-2 Carbohydrate Choices per snack Begin reading food labels for Total Carbohydrate of foods If OK  with your MD, consider  increasing your activity level by walking, Arm Chair Exercises or other activity daily as tolerated Begin checking Blood Glucose before breakfast and 2 hours after first bite of breakfast, lunch and dinner as directed by MD  Bring Log Book/Sheet and meter to every medical appointment  Baby Scripts: Patient was introduced to [employer] at [place] but was not able to get started using it. Take medication if directed by MD  Patient already has a meter and is not testing as directed because it is painful and also because she is afraid of how high it might be.   Patient instructed to monitor glucose levels: FBS: 70 - 95 mg/dl 2 hour: 120 - 100 mg/dl  Patient received the following handouts:  Pre-existing Diabetes in Pregnancy  A1c chart  Carbohydrate Counting List  Blood glucose Log Sheet  PCOS and Inositol  Also provided Omnipod demo  Patient will be seen for follow-up in 2 weeks or as needed.

## 2020-01-30 ENCOUNTER — Other Ambulatory Visit: Payer: Self-pay

## 2020-01-30 ENCOUNTER — Ambulatory Visit (INDEPENDENT_AMBULATORY_CARE_PROVIDER_SITE_OTHER): Payer: Medicaid Other | Admitting: Obstetrics and Gynecology

## 2020-01-30 VITALS — BP 114/78 | HR 64 | Wt 201.0 lb

## 2020-01-30 DIAGNOSIS — O24311 Unspecified pre-existing diabetes mellitus in pregnancy, first trimester: Secondary | ICD-10-CM

## 2020-01-30 DIAGNOSIS — Z3401 Encounter for supervision of normal first pregnancy, first trimester: Secondary | ICD-10-CM | POA: Diagnosis not present

## 2020-01-30 DIAGNOSIS — Z3A15 15 weeks gestation of pregnancy: Secondary | ICD-10-CM

## 2020-01-30 NOTE — Progress Notes (Signed)
° °  PRENATAL VISIT NOTE  Subjective:  Andrea Black is a 24 y.o. G1P0000 at [redacted]w[redacted]d being seen today for ongoing prenatal care.  She is currently monitored for the following issues for this high risk pregnancy and has Abnormal granulation tissue of buttock; Obesity; Dysmenorrhea; Polycystic ovarian disease; Elevated hemoglobin A1c; Prolactin increased; Exposure to sexually transmitted disease (STD); Supervision of normal first pregnancy; Abdominal pain in pregnancy, first trimester; Pre-existing diabetes mellitus affecting pregnancy in first trimester, antepartum; and Group B streptococcal bacteriuria on their problem list.  Patient reports feeling well overall.  Contractions: Not present. Vag. Bleeding: None.  Movement: Present. Denies leaking of fluid. Having some pain in hip.   DMII - saw educator. Worried about having to take insulin. A1c 9.7. Just started taking blood sugars this morning and reports CBG 98.   The following portions of the patient's history were reviewed and updated as appropriate: allergies, current medications, past family history, past medical history, past social history, past surgical history and problem list.   Objective:   Vitals:   01/30/20 1031  BP: 114/78  Pulse: 64  Weight: 201 lb (91.2 kg)    Fetal Status: Fetal Heart Rate (bpm): 156    Movement: Present     General:  Alert, oriented and cooperative. Patient is in no acute distress.  Skin: Skin is warm and dry. No rash noted.   Cardiovascular: Normal heart rate noted  Respiratory: Normal respiratory effort, no problems with respiration noted  Abdomen: Soft, gravid, appropriate for gestational age.  Pain/Pressure: Absent     Pelvic: Cervical exam deferred   Extremities: Normal range of motion.  Edema: Trace  Mental Status: Normal mood and affect. Normal behavior. Normal judgment and thought content.   Assessment and Plan:  Pregnancy: G1P0000 at [redacted]w[redacted]d 1. Encounter for supervision of normal first  pregnancy in first trimester, [redacted] weeks gestation of pregnancy -CMP, AFP, urine P:C  -discussed exercise and staying active in pregnancy  -anat scan scheduled   2. Pre-existing diabetes mellitus affecting pregnancy in first trimester, antepartum -A1C 9.7, anticipate need for insulin however have limited CBG values to assess. Patient is also hopeful to avoid insulin if possible. Will have her come back in two weeks to review her CBG log.  -patient has started taking asa once a day    Preterm labor symptoms and general obstetric precautions including but not limited to vaginal bleeding, contractions, leaking of fluid and fetal movement were reviewed in detail with the patient. Please refer to After Visit Summary for other counseling recommendations.   Return in about 2 weeks (around 02/13/2020) for DMII in pregnancy , MD only face to face.  Future Appointments  Date Time Provider [place]  02/11/2020  3:15 PM [facility]  02/27/2020 10:30 AM [facility]    Janet Berlin, MD

## 2020-01-30 NOTE — Progress Notes (Signed)
Pt presents for ROB and AFP  Just started checking CBG levels yesterday Fasting CBG today was 98

## 2020-01-30 NOTE — Patient Instructions (Signed)
Hip Exercises Ask your health care provider which exercises are safe for you. Do exercises exactly as told by your health care provider and adjust them as directed. It is normal to feel mild stretching, pulling, tightness, or discomfort as you do these exercises. Stop right away if you feel sudden pain or your pain gets worse. Do not begin these exercises until told by your health care provider. Stretching and range-of-motion exercises These exercises warm up your muscles and joints and improve the movement and flexibility of your hip. These exercises also help to relieve pain, numbness, and tingling. You may be asked to limit your range of motion if you had a hip replacement. Talk to your health care provider about these restrictions. Hamstrings, supine  1. Lie on your back (supine position). 2. Loop a belt or towel over the ball of your left / right foot. The ball of your foot is on the walking surface, right under your toes. 3. Straighten your left / right knee and slowly pull on the belt or towel to raise your leg until you feel a gentle stretch behind your knee (hamstring). ? Do not let your knee bend while you do this. ? Keep your other leg flat on the floor. 4. Hold this position for __________ seconds. 5. Slowly return your leg to the starting position. Repeat __________ times. Complete this exercise __________ times a day. Hip rotation  1. Lie on your back on a firm surface. 2. With your left / right hand, gently pull your left / right knee toward the shoulder that is on the same side of the body. Stop when your knee is pointing toward the ceiling. 3. Hold your left / right ankle with your other hand. 4. Keeping your knee steady, gently pull your left / right ankle toward your other shoulder until you feel a stretch in your buttocks. ? Keep your hips and shoulders firmly planted while you do this stretch. 5. Hold this position for __________ seconds. Repeat __________ times. Complete  this exercise __________ times a day. Seated stretch This exercise is sometimes called hamstrings and adductors stretch. 1. Sit on the floor with your legs stretched wide. Keep your knees straight during this exercise. 2. Keeping your head and back in a straight line, bend at your waist to reach for your left foot (position A). You should feel a stretch in your right inner thigh (adductors). 3. Hold this position for __________ seconds. Then slowly return to the upright position. 4. Keeping your head and back in a straight line, bend at your waist to reach forward (position B). You should feel a stretch behind both of your thighs and knees (hamstrings). 5. Hold this position for __________ seconds. Then slowly return to the upright position. 6. Keeping your head and back in a straight line, bend at your waist to reach for your right foot (position C). You should feel a stretch in your left inner thigh (adductors). 7. Hold this position for __________ seconds. Then slowly return to the upright position. Repeat __________ times. Complete this exercise __________ times a day. Lunge This exercise stretches the muscles of the hip (hip flexors). 1. Place your left / right knee on the floor and bend your other knee so that is directly over your ankle. You should be half-kneeling. 2. Keep good posture with your head over your shoulders. 3. Tighten your buttocks to point your tailbone downward. This will prevent your back from arching too much. 4. You should feel a   gentle stretch in the front of your left / right thigh and hip. If you do not feel a stretch, slide your other foot forward slightly and then slowly lunge forward with your chest up until your knee once again lines up over your ankle. ? Make sure your tailbone continues to point downward. 5. Hold this position for __________ seconds. 6. Slowly return to the starting position. Repeat __________ times. Complete this exercise __________ times a  day. Strengthening exercises These exercises build strength and endurance in your hip. Endurance is the ability to use your muscles for a long time, even after they get tired. Bridge This exercise strengthens the muscles of your hip (hip extensors). 1. Lie on your back on a firm surface with your knees bent and your feet flat on the floor. 2. Tighten your buttocks muscles and lift your bottom off the floor until the trunk of your body and your hips are level with your thighs. ? Do not arch your back. ? You should feel the muscles working in your buttocks and the back of your thighs. If you do not feel these muscles, slide your feet 1-2 inches (2.5-5 cm) farther away from your buttocks. 3. Hold this position for __________ seconds. 4. Slowly lower your hips to the starting position. 5. Let your muscles relax completely between repetitions. Repeat __________ times. Complete this exercise __________ times a day. Straight leg raises, side-lying This exercise strengthens the muscles that move the hip joint away from the center of the body (hip abductors). 1. Lie on your side with your left / right leg in the top position. Lie so your head, shoulder, hip, and knee line up. You may bend your bottom knee slightly to help you balance. 2. Roll your hips slightly forward, so your hips are stacked directly over each other and your left / right knee is facing forward. 3. Leading with your heel, lift your top leg 4-6 inches (10-15 cm). You should feel the muscles in your top hip lifting. ? Do not let your foot drift forward. ? Do not let your knee roll toward the ceiling. 4. Hold this position for __________ seconds. 5. Slowly return to the starting position. 6. Let your muscles relax completely between repetitions. Repeat __________ times. Complete this exercise __________ times a day. Straight leg raises, side-lying This exercise strengthens the muscles that move the hip joint toward the center of the  body (hip adductors). 1. Lie on your side with your left / right leg in the bottom position. Lie so your head, shoulder, hip, and knee line up. You may place your upper foot in front to help you balance. 2. Roll your hips slightly forward, so your hips are stacked directly over each other and your left / right knee is facing forward. 3. Tense the muscles in your inner thigh and lift your bottom leg 4-6 inches (10-15 cm). 4. Hold this position for __________ seconds. 5. Slowly return to the starting position. 6. Let your muscles relax completely between repetitions. Repeat __________ times. Complete this exercise __________ times a day. Straight leg raises, supine This exercise strengthens the muscles in the front of your thigh (quadriceps). 1. Lie on your back (supine position) with your left / right leg extended and your other knee bent. 2. Tense the muscles in the front of your left / right thigh. You should see your kneecap slide up or see increased dimpling just above your knee. 3. Keep these muscles tight as you raise your   leg 4-6 inches (10-15 cm) off the floor. Do not let your knee bend. 4. Hold this position for __________ seconds. 5. Keep these muscles tense as you lower your leg. 6. Relax the muscles slowly and completely between repetitions. Repeat __________ times. Complete this exercise __________ times a day. Hip abductors, standing This exercise strengthens the muscles that move the leg and hip joint away from the center of the body (hip abductors). 1. Tie one end of a rubber exercise band or tubing to a secure surface, such as a chair, table, or pole. 2. Loop the other end of the band or tubing around your left / right ankle. 3. Keeping your ankle with the band or tubing directly opposite the secured end, step away until there is tension in the tubing or band. Hold on to a chair, table, or pole as needed for balance. 4. Lift your left / right leg out to your side. While you do  this: ? Keep your back upright. ? Keep your shoulders over your hips. ? Keep your toes pointing forward. ? Make sure to use your hip muscles to slowly lift your leg. Do not tip your body or forcefully lift your leg. 5. Hold this position for __________ seconds. 6. Slowly return to the starting position. Repeat __________ times. Complete this exercise __________ times a day. Squats This exercise strengthens the muscles in the front of your thigh (quadriceps). 1. Stand in a door frame so your feet and knees are in line with the frame. You may place your hands on the frame for balance. 2. Slowly bend your knees and lower your hips like you are going to sit in a chair. ? Keep your lower legs in a straight-up-and-down position. ? Do not let your hips go lower than your knees. ? Do not bend your knees lower than told by your health care provider. ? If your hip pain increases, do not bend as low. 3. Hold this position for ___________ seconds. 4. Slowly push with your legs to return to standing. Do not use your hands to pull yourself to standing. Repeat __________ times. Complete this exercise __________ times a day. This information is not intended to replace advice given to you by your health care provider. Make sure you discuss any questions you have with your health care provider. Document Revised: 01/17/2019 Document Reviewed: 04/24/2018 Elsevier Patient Education  2020 [employer]  

## 2020-02-01 LAB — AFP, SERUM, OPEN SPINA BIFIDA
AFP MoM: 0.91
AFP Value: 25.5 ng/mL
Gest. Age on Collection Date: 15.1 weeks
Maternal Age At EDD: 24 yr
OSBR Risk 1 IN: 10000
Test Results:: NEGATIVE
Weight: 201 [lb_av]

## 2020-02-01 LAB — COMPREHENSIVE METABOLIC PANEL
ALT: 8 IU/L (ref 0–32)
AST: 9 IU/L (ref 0–40)
Albumin/Globulin Ratio: 2 (ref 1.2–2.2)
Albumin: 4.2 g/dL (ref 3.9–5.0)
Alkaline Phosphatase: 47 IU/L — ABNORMAL LOW (ref 48–121)
BUN/Creatinine Ratio: 15 (ref 9–23)
BUN: 7 mg/dL (ref 6–20)
Bilirubin Total: 0.4 mg/dL (ref 0.0–1.2)
CO2: 23 mmol/L (ref 20–29)
Calcium: 9.4 mg/dL (ref 8.7–10.2)
Chloride: 101 mmol/L (ref 96–106)
Creatinine, Ser: 0.47 mg/dL — ABNORMAL LOW (ref 0.57–1.00)
GFR calc Af Amer: 161 mL/min/{1.73_m2} (ref >59)
GFR calc non Af Amer: 140 mL/min/{1.73_m2} (ref >59)
Globulin, Total: 2.1 g/dL (ref 1.5–4.5)
Glucose: 134 mg/dL — ABNORMAL HIGH (ref 65–99)
Potassium: 4.4 mmol/L (ref 3.5–5.2)
Sodium: 135 mmol/L (ref 134–144)
Total Protein: 6.3 g/dL (ref 6.0–8.5)

## 2020-02-01 LAB — PROTEIN / CREATININE RATIO, URINE
Creatinine, Urine: 144 mg/dL
Protein, Ur: 15.4 mg/dL
Protein/Creat Ratio: 107 mg/g creat (ref 0–200)

## 2020-02-11 ENCOUNTER — Other Ambulatory Visit: Payer: Medicaid Other

## 2020-02-11 ENCOUNTER — Telehealth: Payer: Self-pay | Admitting: Registered"

## 2020-02-11 NOTE — Telephone Encounter (Signed)
RD called patient for missed appointment. LVM- generic missed appointment.  RD saw patient on 8/3 for initial diabetes education. Patient A1c was 9.7% but she was not checking her blood sugar at the time. Patient stated part of reason for not checking was due to pain. I was going to suggest that we try sending Rx for CGM to her pharmacy to see if her insurance would cover it. RD also provided information regarding OmniPod during the 8/3 visit.  [facility] was not set up for glucose management. RD updated 8/17 her account for glucose management today.  RD sent MyChart message to patient asking her to take her glucose log to her appointment with Dr. Elgie Congo tomorrow.

## 2020-02-12 ENCOUNTER — Other Ambulatory Visit: Payer: Self-pay

## 2020-02-12 ENCOUNTER — Ambulatory Visit (INDEPENDENT_AMBULATORY_CARE_PROVIDER_SITE_OTHER): Payer: Medicaid Other | Admitting: Obstetrics and Gynecology

## 2020-02-12 ENCOUNTER — Encounter: Payer: Self-pay | Admitting: Obstetrics and Gynecology

## 2020-02-12 VITALS — BP 116/78 | HR 63 | Wt 198.0 lb

## 2020-02-12 DIAGNOSIS — Z3402 Encounter for supervision of normal first pregnancy, second trimester: Secondary | ICD-10-CM

## 2020-02-12 DIAGNOSIS — Z3A17 17 weeks gestation of pregnancy: Secondary | ICD-10-CM

## 2020-02-12 DIAGNOSIS — R7309 Other abnormal glucose: Secondary | ICD-10-CM

## 2020-02-12 DIAGNOSIS — R8271 Bacteriuria: Secondary | ICD-10-CM

## 2020-02-12 DIAGNOSIS — O24311 Unspecified pre-existing diabetes mellitus in pregnancy, first trimester: Secondary | ICD-10-CM

## 2020-02-12 DIAGNOSIS — R519 Headache, unspecified: Secondary | ICD-10-CM

## 2020-02-12 DIAGNOSIS — O26892 Other specified pregnancy related conditions, second trimester: Secondary | ICD-10-CM

## 2020-02-12 HISTORY — DX: Other specified pregnancy related conditions, second trimester: O26.892

## 2020-02-12 HISTORY — DX: Headache, unspecified: R51.9

## 2020-02-12 MED ORDER — CONTINUOUS BLOOD GLUC RECEIVER DEVI: 1.0000 | Freq: Four times a day (QID) | 0 refills | Status: DC | PRN

## 2020-02-12 MED ORDER — CONTINUOUS BLOOD GLUC SENSOR MISC: 1.0000 | 16 refills | Status: DC

## 2020-02-12 MED ORDER — BUTALBITAL-APAP-CAFFEINE 50-325-40 MG PO CAPS: 1.0000 | ORAL_CAPSULE | Freq: Four times a day (QID) | ORAL | 3 refills | Status: DC | PRN

## 2020-02-12 NOTE — Progress Notes (Signed)
ROB  CC: Thigh pain on the left side goes from thigh to knee. Pt notes she has been doing stretches but still no relief.  Pt forgot to bring log of sugars to visit today.

## 2020-02-12 NOTE — Patient Instructions (Signed)
You may need insulin to help you control your blood sugar during pregnancy. You have a couple of options for how you use insulin. If you decide to use vials or pens, you will meet with a Diabetes Educator who will train you in proper injection techniques. The other options is to use a insulin delivery device called an Omnipod. This would be something you wear that takes the place of daily injections. If you are interested in this option, please follow the steps below.  1. Contact Irven Baltimore, the Omnipod rep who will explain the device in detail. (see contact information below) 2. If you are interested in using this insulin delivery options, fill out paperwork provided and send it back to [employer] [facility]. 3. Mickel Baas will contact your MD's office to get prescription for the OmniPod Dash signed.  4. After Mickel Baas received signed Rx orders, the OmniPod device will shipped directly to you. Before it is shipped you will receive a call from [place] to verify your address. 5. After receive the Omnipod device, you will be contacted by an [place] to schedule a 2-hr training session. 6. The trainer will follow-up with you for 2 weeks and make adjustments to the settings as needed. 7. After the initial 2-week after starting OmniPod, you will be followed by your healthcare team for any changes that need to be made for good blood sugar control.  Irven Baltimore, [place] T/ [phone] C/ [phone] F/ [phone]   Second Trimester of Pregnancy  The second trimester is from week 14 through week 27 (month 4 through 6). This is often the time in pregnancy that you feel your best. Often times, morning sickness has lessened or quit. You may have more energy, and you may get hungry more often. Your unborn baby is growing rapidly. At the end of the sixth month, he or she is about 9 inches long and weighs about 1 pounds. You will likely feel the baby move between 18 and 20 weeks of  pregnancy. Follow these instructions at home: Medicines  Take over-the-counter and prescription medicines only as told by your doctor. Some medicines are safe and some medicines are not safe during pregnancy.  Take a prenatal vitamin that contains at least 600 micrograms (mcg) of folic acid.  If you have trouble pooping (constipation), take medicine that will make your stool soft (stool softener) if your doctor approves. Eating and drinking   Eat regular, healthy meals.  Avoid raw meat and uncooked cheese.  If you get low calcium from the food you eat, talk to your doctor about taking a daily calcium supplement.  Avoid foods that are high in fat and sugars, such as fried and sweet foods.  If you feel sick to your stomach (nauseous) or throw up (vomit): ? Eat 4 or 5 small meals a day instead of 3 large meals. ? Try eating a few soda crackers. ? Drink liquids between meals instead of during meals.  To prevent constipation: ? Eat foods that are high in fiber, like fresh fruits and vegetables, whole grains, and beans. ? Drink enough fluids to keep your pee (urine) clear or pale yellow. Activity  Exercise only as told by your doctor. Stop exercising if you start to have cramps.  Do not exercise if it is too hot, too humid, or if you are in a place of great height (high altitude).  Avoid heavy lifting.  Wear low-heeled shoes. Sit and stand up straight.  You can continue  to have sex unless your doctor tells you not to. Relieving pain and discomfort  Wear a good support bra if your breasts are tender.  Take warm water baths (sitz baths) to soothe pain or discomfort caused by hemorrhoids. Use hemorrhoid cream if your doctor approves.  Rest with your legs raised if you have leg cramps or low back pain.  If you develop puffy, bulging veins (varicose veins) in your legs: ? Wear support hose or compression stockings as told by your doctor. ? Raise (elevate) your feet for 15  minutes, 3-4 times a day. ? Limit salt in your food. Prenatal care  Write down your questions. Take them to your prenatal visits.  Keep all your prenatal visits as told by your doctor. This is important. Safety  Wear your seat belt when driving.  Make a list of emergency phone numbers, including numbers for family, friends, the hospital, and police and fire departments. General instructions  Ask your doctor about the right foods to eat or for help finding a counselor, if you need these services.  Ask your doctor about local prenatal classes. Begin classes before month 6 of your pregnancy.  Do not use hot tubs, steam rooms, or saunas.  Do not douche or use tampons or scented sanitary pads.  Do not cross your legs for long periods of time.  Visit your dentist if you have not done so. Use a soft toothbrush to brush your teeth. Floss gently.  Avoid all smoking, herbs, and alcohol. Avoid drugs that are not approved by your doctor.  Do not use any products that contain nicotine or tobacco, such as cigarettes and e-cigarettes. If you need help quitting, ask your doctor.  Avoid cat litter boxes and soil used by cats. These carry germs that can cause birth defects in the baby and can cause a loss of your baby (miscarriage) or stillbirth. Contact a doctor if:  You have mild cramps or pressure in your lower belly.  You have pain when you pee (urinate).  You have bad smelling fluid coming from your vagina.  You continue to feel sick to your stomach (nauseous), throw up (vomit), or have watery poop (diarrhea).  You have a nagging pain in your belly area.  You feel dizzy. Get help right away if:  You have a fever.  You are leaking fluid from your vagina.  You have spotting or bleeding from your vagina.  You have severe belly cramping or pain.  You lose or gain weight rapidly.  You have trouble catching your breath and have chest pain.  You notice sudden or extreme  puffiness (swelling) of your face, hands, ankles, feet, or legs.  You have not felt the baby move in over an hour.  You have severe headaches that do not go away when you take medicine.  You have trouble seeing. Summary  The second trimester is from week 14 through week 27 (months 4 through 6). This is often the time in pregnancy that you feel your best.  To take care of yourself and your unborn baby, you will need to eat healthy meals, take medicines only if your doctor tells you to do so, and do activities that are safe for you and your baby.  Call your doctor if you get sick or if you notice anything unusual about your pregnancy. Also, call your doctor if you need help with the right food to eat, or if you want to know what activities are safe for you. This  information is not intended to replace advice given to you by your health care provider. Make sure you discuss any questions you have with your health care provider. Document Revised: 10/05/2018 Document Reviewed: 07/19/2016 Elsevier Patient Education  [place].  Type 1 or Type 2 Diabetes Mellitus During Pregnancy, Self Care When you have type 1 or type 2 diabetes (diabetes mellitus), you must keep your blood sugar (glucose) in a healthy range. You can do this with:  Nutrition.  Exercise.  Lifestyle changes.  Insulin or medicines, if needed.  Support from your doctors and others. If diabetes is treated, it is unlikely to cause problems for the mother or baby. If it is not treated, it may cause problems that can be harmful to the mother and baby. How to stay aware of blood sugar   Check your blood sugar every day, as often as told.  Call your doctor if your blood sugar is above your goal numbers for 2 tests in a row.  Have your A1c (hemoglobin A1c) level checked at least two times a year. Have it checked more often if your doctor tells you to do that. Your doctor will set personal treatment goals for you. In  general, you should have these blood sugar levels:  After not eating for a long time (fasting): 95 mg/dL (5.3 mmol/L).  After meals (postprandial): ? One hour after a meal: at or below 140 mg/dL (7.8 mmol/L). ? Two hours after a meal: at or below 120 mg/dL (6.7 mmol/L).  A1c level: 6-6.5%. How to manage high and low blood sugar Signs of high blood sugar High blood sugar is called hyperglycemia. Know the early signs of high blood sugar. Signs may include:  Feeling: ? Thirsty. ? Hungry. ? Very tired.  Needing to pee (urinate) more than usual.  Blurry vision. Signs of low blood sugar Low blood sugar is called hypoglycemia. This is when blood sugar is at or below 70 mg/dL (3.9 mmol/L). Signs may include:  Feeling: ? Hungry. ? Worried or nervous (anxious). ? Sweaty and clammy. ? Confused. ? Dizzy. ? Sleepy. ? Sick to your stomach (nauseous).  Having: ? A fast heartbeat. ? A headache. ? A change in your vision. ? Tingling or no feeling (numbness) around your mouth, lips, or tongue. ? Jerky movements that you cannot control (seizure).  Having trouble with: ? Moving (coordination). ? Sleeping. ? Passing out (fainting). ? Getting upset easily (irritability). Treating low blood sugar To treat low blood sugar, eat or drink something sugary right away. If you can think clearly and swallow safely, follow the 15:15 rule:  Take 15 grams of a fast-acting carb (carbohydrate). Some fast-acting carbs are: ? 1 tube of glucose gel. ? 3 sugar tablets (glucose pills). ? 6-8 pieces of hard candy. ? 4 oz (120 mL) of fruit juice. ? 4 oz (120 mL) of regular (not diet) soda.  Check your blood sugar 15 minutes after you take the carb.  If your blood sugar is still at or below 70 mg/dL (3.9 mmol/L), take 15 grams of a carb again.  If your blood sugar does not go above 70 mg/dL (3.9 mmol/L) after 3 tries, get help right away.  After your blood sugar goes back to normal, eat a meal or a  snack within 1 hour. Treating very low blood sugar If your blood sugar is at or below 54 mg/dL (3 mmol/L), you have very low blood sugar (severe hypoglycemia). This is an emergency. Do not wait  to see if the symptoms will go away. Get medical help right away. Call your local emergency services (911 in the [place] If you have very low blood sugar and you cannot eat or drink, you may need a glucagon shot (injection). A family member or friend should learn how to check your blood sugar and how to give you a glucagon shot. Ask your doctor if you need to have a glucagon shot kit at home. Follow these instructions at home: Medicine  Take insulin and diabetes medicines as told.  If your doctor says you should take more or less insulin and medicines, do this exactly as told.  Do not run out of insulin or medicines. Having diabetes can put you at risk for other long-term conditions. These include heart disease and kidney disease. Your doctor may prescribe medicines to prevent these problems. Food   Make healthy food choices. These include: ? Chicken, fish, egg whites, and beans. ? Oats, whole wheat, bulgur, brown rice, quinoa, and millet. ? Fresh fruits and vegetables. ? Low-fat dairy products. ? Nuts, avocado, olive oil, and canola oil.  Meet with a food specialist (dietitian). He or she can help you make an eating plan that is right for you.  Follow instructions from your doctor about what you cannot eat or drink.  Drink enough fluid to keep your pee (urine) pale yellow.  Eat healthy snacks between healthy meals.  Keep track of the carbs you eat. Do this by reading food labels and learning food serving sizes.  Follow your sick day plan when you cannot eat or drink normally. Make this plan with your doctor so it is ready to use. Activity  Exercise for 30 minutes or more a day during your pregnancy or as much as told by your doctor.  Talk with your doctor before you start a new exercise  or activity. Your doctor may need to tell you to change: ? How much insulin or medicines you take. ? How much food you eat. Lifestyle  Do not drink alcohol.  Do not use any tobacco products, such as cigarettes, chewing tobacco, and e-cigarettes. If you need help quitting, ask your doctor.  Learn how to deal with stress. If you need help with this, ask your doctor. Body care  Stay up to date with your shots (immunizations).  Get an eye exam during your first trimester.  Check your skin and feet every day. Check for cuts, bruises, redness, blisters, or sores.  Get regular foot exams as told by your doctor.  Brush your teeth and gums two times a day. Floss one or more times a day.  Go to the dentist one or more times every 6 months.  Stay at a healthy weight during your pregnancy. General instructions  Take over-the-counter and prescription medicines only as told by your doctor.  Talk with your doctor about your risk for high blood pressure during pregnancy (preeclampsia or eclampsia).  Share your diabetes care plan with: ? Your work or school. ? People you live with.  Check your pee for ketones: ? When you are sick. ? As told by your doctor.  Carry a card or wear jewelry that says that you have diabetes.  Keep all follow-up visits with your doctor. This is important. Questions to ask your doctor  Do I need to meet with a diabetes educator?  Where can I find a support group for people with diabetes? Where to find more information To learn more about diabetes, visit:  American Diabetes Association: www.diabetes.org  American Association of Diabetes Educators (AADE): www.diabeteseducator.org Summary  When you have type 1 or type 2 diabetes (diabetes mellitus), you must keep your blood sugar (glucose) in a healthy range.  Check your blood sugar every day, as often as told.  Take insulin and diabetes medicines as told.  Keep all follow-up visits as told by your  doctor. This is important. This information is not intended to replace advice given to you by your health care provider. Make sure you discuss any questions you have with your health care provider. Document Revised: 10/02/2018 Document Reviewed: 07/17/2015 Elsevier Patient Education  2020 [employer].

## 2020-02-12 NOTE — Progress Notes (Signed)
   PRENATAL VISIT NOTE  Subjective:  Andrea Black is a 24 y.o. G1P0000 at [redacted]w[redacted]d being seen today for ongoing prenatal care.  She is currently monitored for the following issues for this high-risk pregnancy and has Abnormal granulation tissue of buttock; Obesity; Dysmenorrhea; Polycystic ovarian disease; Elevated hemoglobin A1c; Prolactin increased; Exposure to sexually transmitted disease (STD); Supervision of normal first pregnancy; Abdominal pain in pregnancy, first trimester; Pre-existing diabetes mellitus affecting pregnancy in first trimester, antepartum; Group B streptococcal bacteriuria; Headache in pregnancy, antepartum, second trimester; and [redacted] weeks gestation of pregnancy on their problem list.  Patient doing well with no acute concerns today. She reports muscle tightness in her thighs..  Contractions: Not present. Vag. Bleeding: None.  Movement: Present. Denies leaking of fluid.   Pt forgot to bring in her blood sugars.  No idea of current control.  Long discussion with patient about continuous blood sugar monitoring per Levie Heritage, RD.  Pt is agreeable to trying the apparatus.  The devices have been ordered.  Info given on ordering the [place] again per Ms. Johnston.  The following portions of the patient's history were reviewed and updated as appropriate: allergies, current medications, past family history, past medical history, past social history, past surgical history and problem list. Problem list updated.  Objective:   Vitals:   02/12/20 1431  BP: 116/78  Pulse: 63  Weight: 198 lb (89.8 kg)    Fetal Status: Fetal Heart Rate (bpm): 146   Movement: Present     General:  Alert, oriented and cooperative. Patient is in no acute distress.  Skin: Skin is warm and dry. No rash noted.   Cardiovascular: Normal heart rate noted  Respiratory: Normal respiratory effort, no problems with respiration noted  Abdomen: Soft, gravid, appropriate for gestational age.   Pain/Pressure: Present     Pelvic: Cervical exam deferred        Extremities: Normal range of motion.  Edema: None  Mental Status:  Normal mood and affect. Normal behavior. Normal judgment and thought content.   Assessment and Plan:  Pregnancy: G1P0000 at [redacted]w[redacted]d  1. Pre-existing diabetes mellitus affecting pregnancy in second trimester, antepartum Likely type 2 DM, but patient was not sure.  Have ordered continuous glucose monitoring ie freestyle Libre for better compliance.  Pt has received info for getting the omnipod through its smartphrase.  2. Elevated hemoglobin A1c Last value 9.7, will review blood sugars in 1 weeks  3. Encounter for supervision of normal first pregnancy in second trimester Pt has anatomy scan on 02/27/2020  4. Group B streptococcal bacteriuria Treat in labor  5. Headache in pregnancy, antepartum, second trimester Sporadic and frequent headaches per pt, will treat - Butalbital-APAP-Caffeine 50-325-40 MG capsule; Take 1-2 capsules by mouth every 6 (six) hours as needed for headache.  Dispense: 30 capsule; Refill: 3  6. [redacted] weeks gestation of pregnancy   Preterm labor symptoms and general obstetric precautions including but not limited to vaginal bleeding, contractions, leaking of fluid and fetal movement were reviewed in detail with the patient.  Please refer to After Visit Summary for other counseling recommendations.   Return in about 3 weeks (around 03/04/2020).   Lynnda Shields, MD

## 2020-02-18 ENCOUNTER — Encounter: Payer: Medicaid Other | Admitting: Obstetrics and Gynecology

## 2020-02-27 ENCOUNTER — Ambulatory Visit: Payer: Medicaid Other | Attending: Women's Health

## 2020-02-27 ENCOUNTER — Ambulatory Visit: Payer: Medicaid Other

## 2020-02-27 ENCOUNTER — Other Ambulatory Visit: Payer: Self-pay | Admitting: Women's Health

## 2020-02-27 ENCOUNTER — Other Ambulatory Visit: Payer: Self-pay | Admitting: *Deleted

## 2020-02-27 ENCOUNTER — Other Ambulatory Visit: Payer: Self-pay

## 2020-02-27 DIAGNOSIS — Z3401 Encounter for supervision of normal first pregnancy, first trimester: Secondary | ICD-10-CM

## 2020-02-27 DIAGNOSIS — Z362 Encounter for other antenatal screening follow-up: Secondary | ICD-10-CM

## 2020-03-04 ENCOUNTER — Encounter: Payer: Self-pay | Admitting: Obstetrics and Gynecology

## 2020-03-04 ENCOUNTER — Other Ambulatory Visit: Payer: Self-pay

## 2020-03-04 ENCOUNTER — Ambulatory Visit (INDEPENDENT_AMBULATORY_CARE_PROVIDER_SITE_OTHER): Payer: Medicaid Other | Admitting: Obstetrics and Gynecology

## 2020-03-04 VITALS — BP 116/69 | HR 76 | Wt 203.7 lb

## 2020-03-04 DIAGNOSIS — R8271 Bacteriuria: Secondary | ICD-10-CM

## 2020-03-04 DIAGNOSIS — O24311 Unspecified pre-existing diabetes mellitus in pregnancy, first trimester: Secondary | ICD-10-CM

## 2020-03-04 DIAGNOSIS — Z3402 Encounter for supervision of normal first pregnancy, second trimester: Secondary | ICD-10-CM

## 2020-03-04 NOTE — Progress Notes (Signed)
Pt is here for ROB, [redacted]w[redacted]d. Pt reports fasting BG level of 95 and 120 after meals.

## 2020-03-04 NOTE — Progress Notes (Signed)
   PRENATAL VISIT NOTE  Subjective:  Andrea Black is a 24 y.o. G1P0000 at [redacted]w[redacted]d being seen today for ongoing prenatal care.  She is currently monitored for the following issues for this high-risk pregnancy and has Obesity; Dysmenorrhea; Polycystic ovarian disease; Exposure to sexually transmitted disease (STD); Supervision of normal first pregnancy; Pre-existing diabetes mellitus affecting pregnancy in first trimester, antepartum; Group B streptococcal bacteriuria; and Headache in pregnancy, antepartum, second trimester on their problem list.  Patient reports no complaints.  Contractions: Not present. Vag. Bleeding: None.  Movement: Present. Denies leaking of fluid.   The following portions of the patient's history were reviewed and updated as appropriate: allergies, current medications, past family history, past medical history, past social history, past surgical history and problem list.   Objective:   Vitals:   03/04/20 0951  BP: 116/69  Pulse: 76  Weight: 203 lb 11.2 oz (92.4 kg)    Fetal Status: Fetal Heart Rate (bpm): 150 Fundal Height: 20 cm Movement: Present     General:  Alert, oriented and cooperative. Patient is in no acute distress.  Skin: Skin is warm and dry. No rash noted.   Cardiovascular: Normal heart rate noted  Respiratory: Normal respiratory effort, no problems with respiration noted  Abdomen: Soft, gravid, appropriate for gestational age.  Pain/Pressure: Present     Pelvic: Cervical exam deferred        Extremities: Normal range of motion.  Edema: Trace  Mental Status: Normal mood and affect. Normal behavior. Normal judgment and thought content.   Assessment and Plan:  Pregnancy: G1P0000 at [redacted]w[redacted]d 1. Encounter for supervision of normal first pregnancy in second trimester Patient is doing well without complaints  2. Pre-existing diabetes mellitus affecting pregnancy in first trimester, antepartum Patient did not bring CBG log or meter and reports all  values within range Normal anatomy ultrasound. Follow up growth on 9/29 Patient scheduled for fetal echo  3. Group B streptococcal bacteriuria Prophylaxis in labor  Preterm labor symptoms and general obstetric precautions including but not limited to vaginal bleeding, contractions, leaking of fluid and fetal movement were reviewed in detail with the patient. Please refer to After Visit Summary for other counseling recommendations.   Return in about 3 weeks (around 03/25/2020) for in person, High risk, ROB.  Future Appointments  Date Time Provider [place]  03/25/2020  2:00 PM [facility] US1 [id number]    Mora Bellman, MD

## 2020-03-25 ENCOUNTER — Ambulatory Visit: Payer: Medicaid Other | Attending: Obstetrics and Gynecology

## 2020-03-25 ENCOUNTER — Encounter: Payer: Medicaid Other | Admitting: Obstetrics and Gynecology

## 2020-04-01 ENCOUNTER — Other Ambulatory Visit (HOSPITAL_COMMUNITY)
Admission: RE | Admit: 2020-04-01 | Discharge: 2020-04-01 | Disposition: A | Discharge status: Home / Self Care | Payer: Medicaid Other | Source: Ambulatory Visit | Attending: Obstetrics and Gynecology | Admitting: Obstetrics and Gynecology

## 2020-04-01 ENCOUNTER — Other Ambulatory Visit: Payer: Self-pay

## 2020-04-01 ENCOUNTER — Ambulatory Visit (INDEPENDENT_AMBULATORY_CARE_PROVIDER_SITE_OTHER): Payer: Medicaid Other | Admitting: Obstetrics and Gynecology

## 2020-04-01 ENCOUNTER — Encounter: Payer: Self-pay | Admitting: Obstetrics and Gynecology

## 2020-04-01 VITALS — BP 116/72 | HR 81 | Wt 204.0 lb

## 2020-04-01 DIAGNOSIS — Z23 Encounter for immunization: Secondary | ICD-10-CM

## 2020-04-01 DIAGNOSIS — Z3402 Encounter for supervision of normal first pregnancy, second trimester: Secondary | ICD-10-CM

## 2020-04-01 DIAGNOSIS — O24311 Unspecified pre-existing diabetes mellitus in pregnancy, first trimester: Secondary | ICD-10-CM

## 2020-04-01 DIAGNOSIS — R8271 Bacteriuria: Secondary | ICD-10-CM

## 2020-04-01 MED ORDER — COMFORT FIT MATERNITY SUPP MED MISC: 0 refills | Status: DC

## 2020-04-01 NOTE — Progress Notes (Addendum)
ROB c/o malodorous, yellowish discharge x7 days and spotting 2 days ago, right knee pain 8/10; denies pain, itching. She has been checking her BS and writing them down put she does not have it with her today.

## 2020-04-01 NOTE — Progress Notes (Signed)
Flu vaccine given RD without difficulty

## 2020-04-01 NOTE — Progress Notes (Signed)
   PRENATAL VISIT NOTE  Subjective:  Andrea Black is a 24 y.o. G1P0000 at [redacted]w[redacted]d being seen today for ongoing prenatal care.  She is currently monitored for the following issues for this high-risk pregnancy and has Obesity; Dysmenorrhea; Polycystic ovarian disease; Exposure to sexually transmitted disease (STD); Supervision of normal first pregnancy; Pre-existing diabetes mellitus affecting pregnancy in first trimester, antepartum; Group B streptococcal bacteriuria; and Headache in pregnancy, antepartum, second trimester on their problem list.  Patient reports sciatic nerve pain.  Contractions: Not present. Vag. Bleeding: None.  Movement: Present. Denies leaking of fluid.   The following portions of the patient's history were reviewed and updated as appropriate: allergies, current medications, past family history, past medical history, past social history, past surgical history and problem list.   Objective:   Vitals:   04/01/20 1437  BP: 116/72  Pulse: 81  Weight: 204 lb (92.5 kg)    Fetal Status: Fetal Heart Rate (bpm): 159 Fundal Height: 24 cm Movement: Present     General:  Alert, oriented and cooperative. Patient is in no acute distress.  Skin: Skin is warm and dry. No rash noted.   Cardiovascular: Normal heart rate noted  Respiratory: Normal respiratory effort, no problems with respiration noted  Abdomen: Soft, gravid, appropriate for gestational age.  Pain/Pressure: Present     Pelvic: Cervical exam deferred        Extremities: Normal range of motion.  Edema: Trace  Mental Status: Normal mood and affect. Normal behavior. Normal judgment and thought content.   Assessment and Plan:  Pregnancy: G1P0000 at [redacted]w[redacted]d 1. Encounter for supervision of normal first pregnancy in second trimester Patient is doing well Vaginal swab collected to evaluate discharge Rx maternity support belt provided  - Cervicovaginal ancillary only( [place])  2. Pre-existing diabetes mellitus  affecting pregnancy in first trimester, antepartum Patient reports fasting below 90 and pp as high as 200 on rare occasions. She states most values are in 110's Patient missed fetal echo appointment-will be rescheduled Patient missed diabetic education appointment- will be rescheduled Patient missed follow up growth ultrasound- will be rescheduled  3. Group B streptococcal bacteriuria Prophylaxis in labor  Preterm labor symptoms and general obstetric precautions including but not limited to vaginal bleeding, contractions, leaking of fluid and fetal movement were reviewed in detail with the patient. Please refer to After Visit Summary for other counseling recommendations.   Return in about 2 weeks (around 04/15/2020) for in person, ROB, High risk.  No future appointments.  Mora Bellman, MD

## 2020-04-02 ENCOUNTER — Other Ambulatory Visit: Payer: Self-pay | Admitting: Obstetrics and Gynecology

## 2020-04-02 LAB — CERVICOVAGINAL ANCILLARY ONLY
Bacterial Vaginitis (gardnerella): POSITIVE — AB
Candida Glabrata: NEGATIVE
Candida Vaginitis: POSITIVE — AB
Chlamydia: NEGATIVE
Comment: NEGATIVE
Comment: NEGATIVE
Comment: NEGATIVE
Comment: NEGATIVE
Comment: NEGATIVE
Comment: NORMAL
Neisseria Gonorrhea: NEGATIVE
Trichomonas: NEGATIVE

## 2020-04-02 MED ORDER — TERCONAZOLE 0.8 % VA CREA: 1.0000 | TOPICAL_CREAM | Freq: Every day | VAGINAL | 0 refills | Status: DC

## 2020-04-02 MED ORDER — METRONIDAZOLE 500 MG PO TABS: 500.0000 mg | ORAL_TABLET | Freq: Two times a day (BID) | ORAL | 0 refills | Status: DC

## 2020-04-02 NOTE — Addendum Note (Signed)
Addended by: Mora Bellman on: 04/02/2020 01:20 PM   Modules accepted: Orders

## 2020-04-09 ENCOUNTER — Other Ambulatory Visit: Payer: Medicaid Other

## 2020-04-15 ENCOUNTER — Ambulatory Visit (INDEPENDENT_AMBULATORY_CARE_PROVIDER_SITE_OTHER): Payer: Medicaid Other | Admitting: Obstetrics and Gynecology

## 2020-04-15 ENCOUNTER — Ambulatory Visit: Payer: Medicaid Other

## 2020-04-15 ENCOUNTER — Other Ambulatory Visit: Payer: Self-pay

## 2020-04-15 VITALS — BP 113/72 | HR 82 | Wt 206.0 lb

## 2020-04-15 DIAGNOSIS — O24312 Unspecified pre-existing diabetes mellitus in pregnancy, second trimester: Secondary | ICD-10-CM

## 2020-04-15 DIAGNOSIS — Z3402 Encounter for supervision of normal first pregnancy, second trimester: Secondary | ICD-10-CM

## 2020-04-15 DIAGNOSIS — Z3A26 26 weeks gestation of pregnancy: Secondary | ICD-10-CM

## 2020-04-15 DIAGNOSIS — M25511 Pain in right shoulder: Secondary | ICD-10-CM | POA: Insufficient documentation

## 2020-04-15 MED ORDER — INSULIN GLARGINE 100 UNIT/ML ~~LOC~~ SOLN: 20.0000 [IU] | Freq: Every day | SUBCUTANEOUS | 11 refills | Status: DC

## 2020-04-15 MED ORDER — INSULIN LISPRO 100 UNIT/ML ~~LOC~~ SOLN: 4.0000 [IU] | Freq: Three times a day (TID) | SUBCUTANEOUS | 11 refills | Status: DC

## 2020-04-15 NOTE — Patient Instructions (Signed)
Type 1 or Type 2 Diabetes Mellitus During Pregnancy, Self Care When you have type 1 or type 2 diabetes (diabetes mellitus), you must keep your blood sugar (glucose) in a healthy range. You can do this with:  Nutrition.  Exercise.  Lifestyle changes.  Insulin or medicines, if needed.  Support from your doctors and others. If diabetes is treated, it is unlikely to cause problems for the mother or baby. If it is not treated, it may cause problems that can be harmful to the mother and baby. How to stay aware of blood sugar   Check your blood sugar every day, as often as told.  Call your doctor if your blood sugar is above your goal numbers for 2 tests in a row.  Have your A1c (hemoglobin A1c) level checked at least two times a year. Have it checked more often if your doctor tells you to do that. Your doctor will set personal treatment goals for you. In general, you should have these blood sugar levels:  After not eating for a long time (fasting): 95 mg/dL (5.3 mmol/L).  After meals (postprandial): ? One hour after a meal: at or below 140 mg/dL (7.8 mmol/L). ? Two hours after a meal: at or below 120 mg/dL (6.7 mmol/L).  A1c level: 6-6.5%. How to manage high and low blood sugar Signs of high blood sugar High blood sugar is called hyperglycemia. Know the early signs of high blood sugar. Signs may include:  Feeling: ? Thirsty. ? Hungry. ? Very tired.  Needing to pee (urinate) more than usual.  Blurry vision. Signs of low blood sugar Low blood sugar is called hypoglycemia. This is when blood sugar is at or below 70 mg/dL (3.9 mmol/L). Signs may include:  Feeling: ? Hungry. ? Worried or nervous (anxious). ? Sweaty and clammy. ? Confused. ? Dizzy. ? Sleepy. ? Sick to your stomach (nauseous).  Having: ? A fast heartbeat. ? A headache. ? A change in your vision. ? Tingling or no feeling (numbness) around your mouth, lips, or tongue. ? Jerky movements that you cannot  control (seizure).  Having trouble with: ? Moving (coordination). ? Sleeping. ? Passing out (fainting). ? Getting upset easily (irritability). Treating low blood sugar To treat low blood sugar, eat or drink something sugary right away. If you can think clearly and swallow safely, follow the 15:15 rule:  Take 15 grams of a fast-acting carb (carbohydrate). Some fast-acting carbs are: ? 1 tube of glucose gel. ? 3 sugar tablets (glucose pills). ? 6-8 pieces of hard candy. ? 4 oz (120 mL) of fruit juice. ? 4 oz (120 mL) of regular (not diet) soda.  Check your blood sugar 15 minutes after you take the carb.  If your blood sugar is still at or below 70 mg/dL (3.9 mmol/L), take 15 grams of a carb again.  If your blood sugar does not go above 70 mg/dL (3.9 mmol/L) after 3 tries, get help right away.  After your blood sugar goes back to normal, eat a meal or a snack within 1 hour. Treating very low blood sugar If your blood sugar is at or below 54 mg/dL (3 mmol/L), you have very low blood sugar (severe hypoglycemia). This is an emergency. Do not wait to see if the symptoms will go away. Get medical help right away. Call your local emergency services (911 in the [place] If you have very low blood sugar and you cannot eat or drink, you may need a glucagon shot (injection). A  family member or friend should learn how to check your blood sugar and how to give you a glucagon shot. Ask your doctor if you need to have a glucagon shot kit at home. °Follow these instructions at home: °Medicine °· Take insulin and diabetes medicines as told. °· If your doctor says you should take more or less insulin and medicines, do this exactly as told. °· Do not run out of insulin or medicines. °Having diabetes can put you at risk for other long-term conditions. These include heart disease and kidney disease. Your doctor may prescribe medicines to prevent these problems. °Food ° °· Make healthy food choices. These  include: °? Chicken, fish, egg whites, and beans. °? Oats, whole wheat, bulgur, brown rice, quinoa, and millet. °? Fresh fruits and vegetables. °? Low-fat dairy products. °? Nuts, avocado, olive oil, and canola oil. °· Meet with a food specialist (dietitian). He or she can help you make an eating plan that is right for you. °· Follow instructions from your doctor about what you cannot eat or drink. °· Drink enough fluid to keep your pee (urine) pale yellow. °· Eat healthy snacks between healthy meals. °· Keep track of the carbs you eat. Do this by reading food labels and learning food serving sizes. °· Follow your sick day plan when you cannot eat or drink normally. Make this plan with your doctor so it is ready to use. °Activity °· Exercise for 30 minutes or more a day during your pregnancy or as much as told by your doctor. °· Talk with your doctor before you start a new exercise or activity. Your doctor may need to tell you to change: °? How much insulin or medicines you take. °? How much food you eat. °Lifestyle °· Do not drink alcohol. °· Do not use any tobacco products, such as cigarettes, chewing tobacco, and e-cigarettes. If you need help quitting, ask your doctor. °· Learn how to deal with stress. If you need help with this, ask your doctor. °Body care °· Stay up to date with your shots (immunizations). °· Get an eye exam during your first trimester. °· Check your skin and feet every day. Check for cuts, bruises, redness, blisters, or sores. °· Get regular foot exams as told by your doctor. °· Brush your teeth and gums two times a day. Floss one or more times a day. °· Go to the dentist one or more times every 6 months. °· Stay at a healthy weight during your pregnancy. °General instructions °· Take over-the-counter and prescription medicines only as told by your doctor. °· Talk with your doctor about your risk for high blood pressure during pregnancy (preeclampsia or eclampsia). °· Share your diabetes care  plan with: °? Your work or school. °? People you live with. °· Check your pee for ketones: °? When you are sick. °? As told by your doctor. °· Carry a card or wear jewelry that says that you have diabetes. °· Keep all follow-up visits with your doctor. This is important. °Questions to ask your doctor °· Do I need to meet with a diabetes educator? °· Where can I find a support group for people with diabetes? °Where to find more information °To learn more about diabetes, visit: °· American Diabetes Association: www.diabetes.org °· American Association of Diabetes Educators (AADE): www.diabeteseducator.org °Summary °· When you have type 1 or type 2 diabetes (diabetes mellitus), you must keep your blood sugar (glucose) in a healthy range. °· Check your blood sugar every   day, as often as told. °· Take insulin and diabetes medicines as told. °· Keep all follow-up visits as told by your doctor. This is important. °This information is not intended to replace advice given to you by your health care provider. Make sure you discuss any questions you have with your health care provider. °Document Revised: 10/02/2018 Document Reviewed: 07/17/2015 °Elsevier Patient Education © 2020 [employer] °Second Trimester of Pregnancy ° °The second trimester is from week 14 through week 27 (month 4 through 6). This is often the time in pregnancy that you feel your best. Often times, morning sickness has lessened or quit. You may have more energy, and you may get hungry more often. Your unborn baby is growing rapidly. At the end of the sixth month, he or she is about 9 inches long and weighs about 1½ pounds. You will likely feel the baby move between 18 and 20 weeks of pregnancy. °Follow these instructions at home: °Medicines °· Take over-the-counter and prescription medicines only as told by your doctor. Some medicines are safe and some medicines are not safe during pregnancy. °· Take a prenatal vitamin that contains at least 600  micrograms (mcg) of folic acid. °· If you have trouble pooping (constipation), take medicine that will make your stool soft (stool softener) if your doctor approves. °Eating and drinking ° °· Eat regular, healthy meals. °· Avoid raw meat and uncooked cheese. °· If you get low calcium from the food you eat, talk to your doctor about taking a daily calcium supplement. °· Avoid foods that are high in fat and sugars, such as fried and sweet foods. °· If you feel sick to your stomach (nauseous) or throw up (vomit): °? Eat 4 or 5 small meals a day instead of 3 large meals. °? Try eating a few soda crackers. °? Drink liquids between meals instead of during meals. °· To prevent constipation: °? Eat foods that are high in fiber, like fresh fruits and vegetables, whole grains, and beans. °? Drink enough fluids to keep your pee (urine) clear or pale yellow. °Activity °· Exercise only as told by your doctor. Stop exercising if you start to have cramps. °· Do not exercise if it is too hot, too humid, or if you are in a place of great height (high altitude). °· Avoid heavy lifting. °· Wear low-heeled shoes. Sit and stand up straight. °· You can continue to have sex unless your doctor tells you not to. °Relieving pain and discomfort °· Wear a good support bra if your breasts are tender. °· Take warm water baths (sitz baths) to soothe pain or discomfort caused by hemorrhoids. Use hemorrhoid cream if your doctor approves. °· Rest with your legs raised if you have leg cramps or low back pain. °· If you develop puffy, bulging veins (varicose veins) in your legs: °? Wear support hose or compression stockings as told by your doctor. °? Raise (elevate) your feet for 15 minutes, 3-4 times a day. °? Limit salt in your food. °Prenatal care °· Write down your questions. Take them to your prenatal visits. °· Keep all your prenatal visits as told by your doctor. This is important. °Safety °· Wear your seat belt when driving. °· Make a list of  emergency phone numbers, including numbers for family, friends, the hospital, and police and fire departments. °General instructions °· Ask your doctor about the right foods to eat or for help finding a counselor, if you need these services. °· Ask your doctor   about local prenatal classes. Begin classes before month 6 of your pregnancy. °· Do not use hot tubs, steam rooms, or saunas. °· Do not douche or use tampons or scented sanitary pads. °· Do not cross your legs for long periods of time. °· Visit your dentist if you have not done so. Use a soft toothbrush to brush your teeth. Floss gently. °· Avoid all smoking, herbs, and alcohol. Avoid drugs that are not approved by your doctor. °· Do not use any products that contain nicotine or tobacco, such as cigarettes and e-cigarettes. If you need help quitting, ask your doctor. °· Avoid cat litter boxes and soil used by cats. These carry germs that can cause birth defects in the baby and can cause a loss of your baby (miscarriage) or stillbirth. °Contact a doctor if: °· You have mild cramps or pressure in your lower belly. °· You have pain when you pee (urinate). °· You have bad smelling fluid coming from your vagina. °· You continue to feel sick to your stomach (nauseous), throw up (vomit), or have watery poop (diarrhea). °· You have a nagging pain in your belly area. °· You feel dizzy. °Get help right away if: °· You have a fever. °· You are leaking fluid from your vagina. °· You have spotting or bleeding from your vagina. °· You have severe belly cramping or pain. °· You lose or gain weight rapidly. °· You have trouble catching your breath and have chest pain. °· You notice sudden or extreme puffiness (swelling) of your face, hands, ankles, feet, or legs. °· You have not felt the baby move in over an hour. °· You have severe headaches that do not go away when you take medicine. °· You have trouble seeing. °Summary °· The second trimester is from week 14 through week  27 (months 4 through 6). This is often the time in pregnancy that you feel your best. °· To take care of yourself and your unborn baby, you will need to eat healthy meals, take medicines only if your doctor tells you to do so, and do activities that are safe for you and your baby. °· Call your doctor if you get sick or if you notice anything unusual about your pregnancy. Also, call your doctor if you need help with the right food to eat, or if you want to know what activities are safe for you. °This information is not intended to replace advice given to you by your health care provider. Make sure you discuss any questions you have with your health care provider. °Document Revised: 10/05/2018 Document Reviewed: 07/19/2016 °Elsevier Patient Education © 2020 [employer] Inc. ° °

## 2020-04-15 NOTE — Progress Notes (Addendum)
HOB, c/o pressure, shoulder pain 10/10. She is checking her BS and writing it on the log which she forgot today; she will start recording it in [employer].

## 2020-04-15 NOTE — Progress Notes (Signed)
° °  PRENATAL VISIT NOTE  Subjective:  Andrea Black is a 24 y.o. G1P0000 at [redacted]w[redacted]d being seen today for ongoing prenatal care.  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  Patient doing well with no acute concerns today. She reports right shoulder pain.  Contractions: Irritability. Vag. Bleeding: None.  Movement: Present. Denies leaking of fluid.   Pt did not bring in glucose log, but notes  FBS: 98-111 PPBS: 80-190  She notes eating late at night and skipping meals.  Pt asked about diabetic teaching, but she has missed 3 previous appointments.  Discussed starting metformin, but she has taken it in the past and it made her feel unwell.  She prefers starting insulin even though she has never taken it before.  Third trimester total insulin 34 units 60% lantus: 20 units 40% regular: 14 units total, 4 units each meal     The following portions of the patient's history were reviewed and updated as appropriate: allergies, current medications, past family history, past medical history, past social history, past surgical history and problem list. Problem list updated.  Objective:   Vitals:   04/15/20 0957  BP: 113/72  Pulse: 82  Weight: 206 lb (93.4 kg)    Fetal Status: Fetal Heart Rate (bpm): 144   Movement: Present     General:  Alert, oriented and cooperative. Patient is in no acute distress.  Skin: Skin is warm and dry. No rash noted.   Cardiovascular: Normal heart rate noted  Respiratory: Normal respiratory effort, no problems with respiration noted    Abdomen: Soft, gravid, appropriate for gestational age.  Pain/Pressure: Present     Pelvic: Cervical exam deferred        Extremities: Normal range of motion.  Edema: Trace  Mental Status:  Normal mood and affect. Normal behavior. Normal judgment and thought content.   Assessment and Plan:  Pregnancy: G1P0000 at [redacted]w[redacted]d  1. Encounter for supervision of normal first pregnancy in second trimester Pt has growth scan 11/3, testing at 32 weeks  2. [redacted] weeks gestation of pregnancy   3. 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  Dispense: 10 mL; Refill: 11 - insulin lispro (HUMALOG) 100 UNIT/ML injection; Inject 0.04 mLs (4 Units total) into the skin 3 (three) times daily before meals.  Dispense: 10 mL; Refill: 11  4. Acute pain of right shoulder  - Ambulatory referral to Physical Therapy  Preterm labor symptoms and general obstetric precautions including but not limited to vaginal bleeding, contractions, leaking of fluid and fetal movement were reviewed in detail with the patient.  Please refer to After Visit Summary for other counseling recommendations.   Return in about 2 weeks (around 04/29/2020) for [facility], in person, 3rd trim labs.   Lynnda Shields, MD

## 2020-04-23 ENCOUNTER — Encounter: Payer: Self-pay | Admitting: Obstetrics

## 2020-04-23 ENCOUNTER — Other Ambulatory Visit: Payer: Medicaid Other

## 2020-04-23 DIAGNOSIS — O24112 Pre-existing diabetes mellitus, type 2, in pregnancy, second trimester: Secondary | ICD-10-CM | POA: Diagnosis not present

## 2020-04-29 ENCOUNTER — Encounter: Payer: Medicaid Other | Admitting: Obstetrics and Gynecology

## 2020-04-29 ENCOUNTER — Ambulatory Visit: Payer: Medicaid Other | Attending: Obstetrics

## 2020-04-29 ENCOUNTER — Other Ambulatory Visit: Payer: Self-pay

## 2020-04-29 DIAGNOSIS — O99213 Obesity complicating pregnancy, third trimester: Secondary | ICD-10-CM

## 2020-04-29 DIAGNOSIS — Z3A28 28 weeks gestation of pregnancy: Secondary | ICD-10-CM | POA: Diagnosis not present

## 2020-04-29 DIAGNOSIS — Z794 Long term (current) use of insulin: Secondary | ICD-10-CM

## 2020-04-29 DIAGNOSIS — O24113 Pre-existing diabetes mellitus, type 2, in pregnancy, third trimester: Secondary | ICD-10-CM

## 2020-04-29 DIAGNOSIS — E669 Obesity, unspecified: Secondary | ICD-10-CM

## 2020-04-29 DIAGNOSIS — E119 Type 2 diabetes mellitus without complications: Secondary | ICD-10-CM | POA: Diagnosis not present

## 2020-04-29 DIAGNOSIS — Z362 Encounter for other antenatal screening follow-up: Secondary | ICD-10-CM | POA: Diagnosis not present

## 2020-04-30 ENCOUNTER — Other Ambulatory Visit: Payer: Self-pay | Admitting: *Deleted

## 2020-04-30 DIAGNOSIS — O24913 Unspecified diabetes mellitus in pregnancy, third trimester: Secondary | ICD-10-CM

## 2020-04-30 NOTE — Progress Notes (Unsigned)
Us/

## 2020-05-27 ENCOUNTER — Other Ambulatory Visit: Payer: Self-pay | Admitting: *Deleted

## 2020-05-27 ENCOUNTER — Ambulatory Visit: Payer: Medicaid Other | Attending: Obstetrics and Gynecology

## 2020-05-27 ENCOUNTER — Encounter: Payer: Self-pay | Admitting: *Deleted

## 2020-05-27 ENCOUNTER — Other Ambulatory Visit: Payer: Self-pay

## 2020-05-27 ENCOUNTER — Ambulatory Visit: Payer: Medicaid Other | Admitting: *Deleted

## 2020-05-27 DIAGNOSIS — O24913 Unspecified diabetes mellitus in pregnancy, third trimester: Secondary | ICD-10-CM | POA: Diagnosis not present

## 2020-05-27 DIAGNOSIS — O99213 Obesity complicating pregnancy, third trimester: Secondary | ICD-10-CM

## 2020-05-27 DIAGNOSIS — E669 Obesity, unspecified: Secondary | ICD-10-CM | POA: Diagnosis not present

## 2020-05-27 DIAGNOSIS — R8271 Bacteriuria: Secondary | ICD-10-CM

## 2020-05-27 DIAGNOSIS — Z362 Encounter for other antenatal screening follow-up: Secondary | ICD-10-CM | POA: Diagnosis not present

## 2020-05-27 DIAGNOSIS — O24113 Pre-existing diabetes mellitus, type 2, in pregnancy, third trimester: Secondary | ICD-10-CM | POA: Diagnosis not present

## 2020-05-27 DIAGNOSIS — Z3A32 32 weeks gestation of pregnancy: Secondary | ICD-10-CM

## 2020-05-27 DIAGNOSIS — E119 Type 2 diabetes mellitus without complications: Secondary | ICD-10-CM

## 2020-05-27 DIAGNOSIS — O403XX0 Polyhydramnios, third trimester, not applicable or unspecified: Secondary | ICD-10-CM

## 2020-05-27 DIAGNOSIS — O24312 Unspecified pre-existing diabetes mellitus in pregnancy, second trimester: Secondary | ICD-10-CM

## 2020-06-01 ENCOUNTER — Ambulatory Visit (INDEPENDENT_AMBULATORY_CARE_PROVIDER_SITE_OTHER): Payer: Medicaid Other | Admitting: Obstetrics and Gynecology

## 2020-06-01 ENCOUNTER — Other Ambulatory Visit: Payer: Self-pay

## 2020-06-01 ENCOUNTER — Other Ambulatory Visit (HOSPITAL_COMMUNITY)
Admission: RE | Admit: 2020-06-01 | Discharge: 2020-06-01 | Disposition: A | Discharge status: Home / Self Care | Payer: Medicaid Other | Source: Ambulatory Visit | Attending: Obstetrics and Gynecology | Admitting: Obstetrics and Gynecology

## 2020-06-01 VITALS — BP 113/73 | HR 71 | Wt 210.3 lb

## 2020-06-01 DIAGNOSIS — R8271 Bacteriuria: Secondary | ICD-10-CM

## 2020-06-01 DIAGNOSIS — O24313 Unspecified pre-existing diabetes mellitus in pregnancy, third trimester: Secondary | ICD-10-CM

## 2020-06-01 DIAGNOSIS — Z3403 Encounter for supervision of normal first pregnancy, third trimester: Secondary | ICD-10-CM | POA: Diagnosis not present

## 2020-06-01 DIAGNOSIS — Z3A32 32 weeks gestation of pregnancy: Secondary | ICD-10-CM | POA: Insufficient documentation

## 2020-06-01 MED ORDER — METFORMIN HCL 500 MG PO TABS: 500.0000 mg | ORAL_TABLET | Freq: Every day | ORAL | 5 refills | Status: DC

## 2020-06-01 NOTE — Progress Notes (Signed)
   PRENATAL VISIT NOTE  Subjective:  Andrea Black is a 24 y.o. G1P0000 at [redacted]w[redacted]d being seen today for ongoing prenatal care.  She is currently monitored for the following issues for this high-risk pregnancy and has Obesity; Dysmenorrhea; Polycystic ovarian disease; Exposure to sexually transmitted disease (STD); Supervision of normal first pregnancy; Pre-existing diabetes mellitus affecting pregnancy in third trimester, antepartum; Group B streptococcal bacteriuria; Headache in pregnancy, antepartum, second trimester; [redacted] weeks gestation of pregnancy; Shoulder pain, right; and [redacted] weeks gestation of pregnancy on their problem list.  Patient doing well with no acute concerns today. She reports no complaints.  Contractions: Not present. Vag. Bleeding: None.  Movement: Present. Denies leaking of fluid.   Pt is not taking insulin because she has not been able to be trained in its usage.  All fastings are elevated, but most postprandials range from 103-122  The following portions of the patient's history were reviewed and updated as appropriate: allergies, current medications, past family history, past medical history, past social history, past surgical history and problem list. Problem list updated.  Objective:   Vitals:   06/01/20 1040  BP: 113/73  Pulse: 71  Weight: 210 lb 4.8 oz (95.4 kg)    Fetal Status: Fetal Heart Rate (bpm): 147   Movement: Present     General:  Alert, oriented and cooperative. Patient is in no acute distress.  Skin: Skin is warm and dry. No rash noted.   Cardiovascular: Normal heart rate noted  Respiratory: Normal respiratory effort, no problems with respiration noted  Abdomen: Soft, gravid, appropriate for gestational age.  Pain/Pressure: Present     Pelvic: Cervical exam deferred        Extremities: Normal range of motion.  Edema: Trace  Mental Status:  Normal mood and affect. Normal behavior. Normal judgment and thought content.   Assessment and Plan:   Pregnancy: G1P0000 at [redacted]w[redacted]d  1. Encounter for supervision of normal first pregnancy in third trimester  - Cervicovaginal ancillary only( [place])  2. Group B streptococcal bacteriuria Treat in labor  3. [redacted] weeks gestation of pregnancy   4. Pre-existing diabetes mellitus affecting pregnancy in third trimester, antepartum Start metformin and recheck blood sugars in 1 weeks.  Send no referral for diabetic teaching Hold insulin for now as it may make her hypoglycemic.  Continue weekly testing. - metFORMIN (GLUCOPHAGE) 500 MG tablet; Take 1 tablet (500 mg total) by mouth at bedtime.  Dispense: 30 tablet; Refill: 5  Preterm labor symptoms and general obstetric precautions including but not limited to vaginal bleeding, contractions, leaking of fluid and fetal movement were reviewed in detail with the patient.  Please refer to After Visit Summary for other counseling recommendations.   Return in about 1 week (around 06/08/2020) for [facility], in person.   Lynnda Shields, MD

## 2020-06-01 NOTE — Progress Notes (Signed)
Pt reports fetal movement with occasional pressure, pt reports vaginal discharge, but wishes to do a self swab today.

## 2020-06-02 LAB — CERVICOVAGINAL ANCILLARY ONLY
Bacterial Vaginitis (gardnerella): POSITIVE — AB
Candida Glabrata: NEGATIVE
Candida Vaginitis: POSITIVE — AB
Chlamydia: NEGATIVE
Comment: NEGATIVE
Comment: NEGATIVE
Comment: NEGATIVE
Comment: NEGATIVE
Comment: NEGATIVE
Comment: NORMAL
Neisseria Gonorrhea: NEGATIVE
Trichomonas: NEGATIVE

## 2020-06-03 ENCOUNTER — Ambulatory Visit: Payer: Medicaid Other

## 2020-06-03 ENCOUNTER — Telehealth: Payer: Self-pay | Admitting: *Deleted

## 2020-06-03 ENCOUNTER — Other Ambulatory Visit: Payer: Medicaid Other

## 2020-06-03 NOTE — Telephone Encounter (Signed)
Left voice message for patient to return nurse call regarding test results.  Derl Barrow, RN

## 2020-06-04 ENCOUNTER — Other Ambulatory Visit: Payer: Self-pay

## 2020-06-04 DIAGNOSIS — B379 Candidiasis, unspecified: Secondary | ICD-10-CM

## 2020-06-04 DIAGNOSIS — B9689 Other specified bacterial agents as the cause of diseases classified elsewhere: Secondary | ICD-10-CM

## 2020-06-04 DIAGNOSIS — N760 Acute vaginitis: Secondary | ICD-10-CM

## 2020-06-04 MED ORDER — TERCONAZOLE 0.4 % VA CREA: 1.0000 | TOPICAL_CREAM | Freq: Every day | VAGINAL | 0 refills | Status: DC

## 2020-06-04 MED ORDER — METRONIDAZOLE 500 MG PO TABS: 500.0000 mg | ORAL_TABLET | Freq: Two times a day (BID) | ORAL | 0 refills | Status: AC

## 2020-06-09 ENCOUNTER — Other Ambulatory Visit: Payer: Medicaid Other

## 2020-06-09 ENCOUNTER — Encounter: Payer: Medicaid Other | Admitting: Obstetrics and Gynecology

## 2020-06-10 ENCOUNTER — Encounter: Payer: Self-pay | Admitting: *Deleted

## 2020-06-10 ENCOUNTER — Ambulatory Visit: Payer: Medicaid Other | Attending: Obstetrics and Gynecology

## 2020-06-10 ENCOUNTER — Ambulatory Visit: Payer: Medicaid Other | Admitting: *Deleted

## 2020-06-10 ENCOUNTER — Other Ambulatory Visit: Payer: Self-pay

## 2020-06-10 ENCOUNTER — Ambulatory Visit: Payer: Medicaid Other

## 2020-06-10 DIAGNOSIS — O24913 Unspecified diabetes mellitus in pregnancy, third trimester: Secondary | ICD-10-CM | POA: Diagnosis not present

## 2020-06-10 DIAGNOSIS — R8271 Bacteriuria: Secondary | ICD-10-CM | POA: Diagnosis not present

## 2020-06-10 DIAGNOSIS — O24313 Unspecified pre-existing diabetes mellitus in pregnancy, third trimester: Secondary | ICD-10-CM

## 2020-06-10 DIAGNOSIS — Z3A34 34 weeks gestation of pregnancy: Secondary | ICD-10-CM | POA: Diagnosis not present

## 2020-06-11 ENCOUNTER — Other Ambulatory Visit: Payer: Self-pay

## 2020-06-11 ENCOUNTER — Encounter: Payer: Medicaid Other | Attending: Women's Health | Admitting: Registered"

## 2020-06-11 ENCOUNTER — Encounter: Payer: Self-pay | Admitting: Registered"

## 2020-06-11 DIAGNOSIS — O24313 Unspecified pre-existing diabetes mellitus in pregnancy, third trimester: Secondary | ICD-10-CM | POA: Insufficient documentation

## 2020-06-11 LAB — HEMOGLOBIN A1C
Est. average glucose Bld gHb Est-mCnc: 143 mg/dL
Hgb A1c MFr Bld: 6.6 % — ABNORMAL HIGH (ref 4.8–5.6)

## 2020-06-11 NOTE — Patient Instructions (Signed)
.   If not able to eat, consider drinking nutritional supplements . If eating a snack during the night, consider food that is mostly protein or healthy fat . Use the log book provided to track your numbers . Share your Dexcom data through Clarity app with RD

## 2020-06-11 NOTE — Progress Notes (Signed)
Patient was seen on 06/11/20 for follow-up assessment and education for Type 2 Diabetes in pregnancy. EDD.  Per visit with MD on 06/01/20. Patient was instructed to only use metfomin 1x/day and to hold off on the insulin. Pt states she has the insulin in the refrigerator, but has not used it.  Patient is not testing blood glucose as directed pre breakfast and 2 hours after each meal.  RD cannot find patient in Babyscripts: Pt states she has numbers ranging from 86-136 mg/dL, but may only check 1-2x/day because does not have appetite since starting metformin, only nibbles on food.   It is not clear how to help patient without blood sugar data so RD started patient on CGM Dexcom G6 during visit.  The following learning objectives reviewed during follow-up visit:   Snacks to eat during the night that won't contribute to elevated blood sugar  Plan:  . If not able to eat, consider drinking nutritional supplements . If eating a snack during the night, consider food that is mostly protein or healthy fat . Use the log book provided to track your numbers . Share your Dexcom data through Clarity app with RD . Watch the [employer] and read the user guide (told patient but forgot to include on AVS)   Patient instructed to monitor glucose levels: FBS: 60 - 95 mg/dl 2 hour: <120 mg/dl  Patient received the following handouts:   Ensure Max Protein Lot [id number], Exp [id number]  Glucerna 30 g protein shake, Lot # [id number] Exp [id number]  Provided starter CGM Dexcom G6 Lot [id number], Exp 11-19-2020  Patient will be seen for follow-up in as needed.

## 2020-06-17 ENCOUNTER — Encounter: Payer: Self-pay | Admitting: *Deleted

## 2020-06-17 ENCOUNTER — Other Ambulatory Visit: Payer: Self-pay

## 2020-06-17 ENCOUNTER — Ambulatory Visit: Payer: Medicaid Other | Admitting: *Deleted

## 2020-06-17 ENCOUNTER — Ambulatory Visit: Payer: Medicaid Other | Attending: Obstetrics and Gynecology

## 2020-06-17 DIAGNOSIS — O24313 Unspecified pre-existing diabetes mellitus in pregnancy, third trimester: Secondary | ICD-10-CM | POA: Diagnosis not present

## 2020-06-17 DIAGNOSIS — O24435 Gestational diabetes mellitus in puerperium, controlled by oral hypoglycemic drugs: Secondary | ICD-10-CM | POA: Diagnosis not present

## 2020-06-17 DIAGNOSIS — O99213 Obesity complicating pregnancy, third trimester: Secondary | ICD-10-CM | POA: Diagnosis not present

## 2020-06-17 DIAGNOSIS — E669 Obesity, unspecified: Secondary | ICD-10-CM

## 2020-06-17 DIAGNOSIS — O403XX0 Polyhydramnios, third trimester, not applicable or unspecified: Secondary | ICD-10-CM

## 2020-06-17 DIAGNOSIS — R8271 Bacteriuria: Secondary | ICD-10-CM | POA: Insufficient documentation

## 2020-06-17 DIAGNOSIS — Z362 Encounter for other antenatal screening follow-up: Secondary | ICD-10-CM

## 2020-06-17 DIAGNOSIS — O24913 Unspecified diabetes mellitus in pregnancy, third trimester: Secondary | ICD-10-CM | POA: Diagnosis not present

## 2020-06-17 DIAGNOSIS — Z3A35 35 weeks gestation of pregnancy: Secondary | ICD-10-CM

## 2020-06-17 DIAGNOSIS — E119 Type 2 diabetes mellitus without complications: Secondary | ICD-10-CM

## 2020-06-22 ENCOUNTER — Telehealth: Payer: Self-pay | Admitting: Registered"

## 2020-06-22 NOTE — Telephone Encounter (Signed)
Patient started wearing CGM 10-day sample Dexcom G6 on 12/16. Pt wants to continue using and RD will talk to clinical team 12/28 about getting a pre-auth started to submit Rx.   Patient is sharing data with RD through Clarity app. Graphs show overlay of BG last 10 days:

## 2020-06-23 ENCOUNTER — Telehealth: Payer: Self-pay | Admitting: Registered"

## 2020-06-23 NOTE — Telephone Encounter (Signed)
PA in process for patient CGM.

## 2020-06-23 NOTE — Telephone Encounter (Signed)
-----   Message from Catalina Antigua, MD sent at 06/22/2020  2:45 PM EST ----- Patient is scheduled on 12/29 with Dr. Erin Fulling. Please obtain pre-authorization in order to keep continuous glucose monitor

## 2020-06-24 ENCOUNTER — Ambulatory Visit: Payer: Medicaid Other | Attending: Obstetrics and Gynecology

## 2020-06-24 ENCOUNTER — Other Ambulatory Visit: Payer: Self-pay

## 2020-06-24 ENCOUNTER — Encounter: Payer: Self-pay | Admitting: Obstetrics & Gynecology

## 2020-06-24 ENCOUNTER — Ambulatory Visit: Payer: Medicaid Other | Admitting: *Deleted

## 2020-06-24 ENCOUNTER — Encounter: Payer: Self-pay | Admitting: *Deleted

## 2020-06-24 ENCOUNTER — Ambulatory Visit (INDEPENDENT_AMBULATORY_CARE_PROVIDER_SITE_OTHER): Payer: Medicaid Other | Admitting: Obstetrics & Gynecology

## 2020-06-24 ENCOUNTER — Other Ambulatory Visit (HOSPITAL_COMMUNITY)
Admission: RE | Admit: 2020-06-24 | Discharge: 2020-06-24 | Disposition: A | Discharge status: Home / Self Care | Payer: Medicaid Other | Source: Ambulatory Visit | Attending: Obstetrics & Gynecology | Admitting: Obstetrics & Gynecology

## 2020-06-24 ENCOUNTER — Encounter: Payer: Medicaid Other | Admitting: Obstetrics & Gynecology

## 2020-06-24 VITALS — BP 114/75 | HR 51 | Wt 214.0 lb

## 2020-06-24 DIAGNOSIS — O24913 Unspecified diabetes mellitus in pregnancy, third trimester: Secondary | ICD-10-CM | POA: Diagnosis not present

## 2020-06-24 DIAGNOSIS — R8271 Bacteriuria: Secondary | ICD-10-CM | POA: Diagnosis not present

## 2020-06-24 DIAGNOSIS — O24313 Unspecified pre-existing diabetes mellitus in pregnancy, third trimester: Secondary | ICD-10-CM

## 2020-06-24 DIAGNOSIS — Z362 Encounter for other antenatal screening follow-up: Secondary | ICD-10-CM

## 2020-06-24 DIAGNOSIS — Z3403 Encounter for supervision of normal first pregnancy, third trimester: Secondary | ICD-10-CM

## 2020-06-24 DIAGNOSIS — Z3A36 36 weeks gestation of pregnancy: Secondary | ICD-10-CM | POA: Insufficient documentation

## 2020-06-24 DIAGNOSIS — E669 Obesity, unspecified: Secondary | ICD-10-CM

## 2020-06-24 DIAGNOSIS — E119 Type 2 diabetes mellitus without complications: Secondary | ICD-10-CM

## 2020-06-24 DIAGNOSIS — O99213 Obesity complicating pregnancy, third trimester: Secondary | ICD-10-CM | POA: Diagnosis not present

## 2020-06-24 DIAGNOSIS — O26893 Other specified pregnancy related conditions, third trimester: Secondary | ICD-10-CM | POA: Diagnosis not present

## 2020-06-24 DIAGNOSIS — O24113 Pre-existing diabetes mellitus, type 2, in pregnancy, third trimester: Secondary | ICD-10-CM

## 2020-06-24 DIAGNOSIS — O403XX0 Polyhydramnios, third trimester, not applicable or unspecified: Secondary | ICD-10-CM

## 2020-06-24 NOTE — Telephone Encounter (Signed)
Opened in error. Communicated with patient via MyChart instead

## 2020-06-24 NOTE — Progress Notes (Signed)
   PRENATAL VISIT NOTE  Subjective:  Andrea Black is a 24 y.o. G1P0000 at [redacted]w[redacted]d being seen today for ongoing prenatal care.  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  Patient reports no complaints.  Contractions: Irregular. Vag. Bleeding: None.  Movement: Present. Denies leaking of fluid.   The following portions of the patient's history were reviewed and updated as appropriate: allergies, current medications, past family history, past medical history, past social history, past surgical history and problem list.   Objective:   Vitals:   06/24/20 1453  BP: 114/75  Pulse: (!) 51  Weight: 214 lb (97.1 kg)    Fetal Status: Fetal Heart Rate (bpm): 145   Movement: Present     General:  Alert, oriented and cooperative. Patient is in no acute distress.  Skin: Skin is warm and dry. No rash noted.   Cardiovascular: Normal heart rate noted  Respiratory: Normal respiratory effort, no problems with respiration noted  Abdomen: Soft, gravid, appropriate for gestational age.  Pain/Pressure: Present     Pelvic: Cervical exam deferred        Extremities: Normal range of motion.     Mental Status: Normal mood and affect. Normal behavior. Normal judgment and thought content.   Assessment and Plan:  Pregnancy: G1P0000 at [redacted]w[redacted]d 1. Encounter for supervision of normal first pregnancy in third trimester Pts pump ran out and she has been checking her glc q 2 hours.    Pt would like to stay preggo a bit longer  if possible but, if she must be induced she will   2. [redacted] weeks gestation of pregnancy cx sent via urine today  3. 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 All other levels are WNL Can check fastings and 2 hour PP tid.    Weekly BPP  4. Group B streptococcal bacteriuria Need atbx in   5. 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 Please refer to After Visit Summary for other counseling recommendations.   Return in about 1 week (around 07/01/2020).  Future Appointments  Date Time Provider Department Center  07/01/2020 11:45 AM [facility] US[facility]    Willodean Rosenthal, MD

## 2020-06-24 NOTE — Telephone Encounter (Signed)
Patient was started on different blood glucose monitor. PA will be completed once the pharmacy sends over the request.

## 2020-06-28 LAB — URINE CYTOLOGY ANCILLARY ONLY
Chlamydia: NEGATIVE
Comment: NEGATIVE
Comment: NORMAL
Neisseria Gonorrhea: NEGATIVE

## 2020-07-01 ENCOUNTER — Ambulatory Visit (HOSPITAL_BASED_OUTPATIENT_CLINIC_OR_DEPARTMENT_OTHER): Payer: Medicaid Other

## 2020-07-01 ENCOUNTER — Encounter: Payer: Self-pay | Admitting: *Deleted

## 2020-07-01 ENCOUNTER — Ambulatory Visit: Payer: Medicaid Other | Admitting: *Deleted

## 2020-07-01 ENCOUNTER — Other Ambulatory Visit: Payer: Self-pay | Admitting: Obstetrics and Gynecology

## 2020-07-01 ENCOUNTER — Other Ambulatory Visit: Payer: Self-pay

## 2020-07-01 DIAGNOSIS — O24913 Unspecified diabetes mellitus in pregnancy, third trimester: Secondary | ICD-10-CM | POA: Insufficient documentation

## 2020-07-01 DIAGNOSIS — O24313 Unspecified pre-existing diabetes mellitus in pregnancy, third trimester: Secondary | ICD-10-CM | POA: Insufficient documentation

## 2020-07-01 DIAGNOSIS — O99213 Obesity complicating pregnancy, third trimester: Secondary | ICD-10-CM | POA: Diagnosis not present

## 2020-07-01 DIAGNOSIS — R8271 Bacteriuria: Secondary | ICD-10-CM | POA: Insufficient documentation

## 2020-07-01 DIAGNOSIS — O24113 Pre-existing diabetes mellitus, type 2, in pregnancy, third trimester: Secondary | ICD-10-CM | POA: Diagnosis not present

## 2020-07-01 DIAGNOSIS — O403XX0 Polyhydramnios, third trimester, not applicable or unspecified: Secondary | ICD-10-CM | POA: Diagnosis not present

## 2020-07-01 DIAGNOSIS — Z3A37 37 weeks gestation of pregnancy: Secondary | ICD-10-CM

## 2020-07-01 DIAGNOSIS — E669 Obesity, unspecified: Secondary | ICD-10-CM | POA: Diagnosis not present

## 2020-07-02 ENCOUNTER — Encounter (HOSPITAL_COMMUNITY): Payer: Self-pay | Admitting: Family Medicine

## 2020-07-02 ENCOUNTER — Other Ambulatory Visit: Payer: Self-pay

## 2020-07-02 ENCOUNTER — Telehealth (INDEPENDENT_AMBULATORY_CARE_PROVIDER_SITE_OTHER): Payer: Medicaid Other | Admitting: Obstetrics and Gynecology

## 2020-07-02 ENCOUNTER — Inpatient Hospital Stay (HOSPITAL_COMMUNITY)
Admission: AD | Admit: 2020-07-02 | Discharge: 2020-07-07 | DRG: 786 | Disposition: A | Discharge status: Home / Self Care | Payer: Medicaid Other | Attending: Obstetrics and Gynecology | Admitting: Obstetrics and Gynecology

## 2020-07-02 VITALS — BP 168/93 | HR 57

## 2020-07-02 DIAGNOSIS — O9832 Other infections with a predominantly sexual mode of transmission complicating childbirth: Secondary | ICD-10-CM | POA: Diagnosis present

## 2020-07-02 DIAGNOSIS — O99893 Other specified diseases and conditions complicating puerperium: Secondary | ICD-10-CM | POA: Diagnosis present

## 2020-07-02 DIAGNOSIS — I9581 Postprocedural hypotension: Secondary | ICD-10-CM | POA: Diagnosis not present

## 2020-07-02 DIAGNOSIS — Z8619 Personal history of other infectious and parasitic diseases: Secondary | ICD-10-CM | POA: Diagnosis present

## 2020-07-02 DIAGNOSIS — D62 Acute posthemorrhagic anemia: Secondary | ICD-10-CM | POA: Diagnosis not present

## 2020-07-02 DIAGNOSIS — O2413 Pre-existing diabetes mellitus, type 2, in the puerperium: Secondary | ICD-10-CM | POA: Diagnosis not present

## 2020-07-02 DIAGNOSIS — I959 Hypotension, unspecified: Secondary | ICD-10-CM

## 2020-07-02 DIAGNOSIS — F1290 Cannabis use, unspecified, uncomplicated: Secondary | ICD-10-CM | POA: Diagnosis present

## 2020-07-02 DIAGNOSIS — N179 Acute kidney failure, unspecified: Secondary | ICD-10-CM | POA: Diagnosis not present

## 2020-07-02 DIAGNOSIS — U071 COVID-19: Secondary | ICD-10-CM | POA: Diagnosis not present

## 2020-07-02 DIAGNOSIS — Z87891 Personal history of nicotine dependence: Secondary | ICD-10-CM | POA: Diagnosis not present

## 2020-07-02 DIAGNOSIS — E1165 Type 2 diabetes mellitus with hyperglycemia: Secondary | ICD-10-CM | POA: Diagnosis present

## 2020-07-02 DIAGNOSIS — O9833 Other infections with a predominantly sexual mode of transmission complicating the puerperium: Secondary | ICD-10-CM | POA: Diagnosis not present

## 2020-07-02 DIAGNOSIS — O904 Postpartum acute kidney failure: Secondary | ICD-10-CM | POA: Diagnosis not present

## 2020-07-02 DIAGNOSIS — O24313 Unspecified pre-existing diabetes mellitus in pregnancy, third trimester: Secondary | ICD-10-CM | POA: Diagnosis not present

## 2020-07-02 DIAGNOSIS — R8271 Bacteriuria: Secondary | ICD-10-CM | POA: Diagnosis not present

## 2020-07-02 DIAGNOSIS — O2412 Pre-existing diabetes mellitus, type 2, in childbirth: Secondary | ICD-10-CM | POA: Diagnosis present

## 2020-07-02 DIAGNOSIS — Z7984 Long term (current) use of oral hypoglycemic drugs: Secondary | ICD-10-CM | POA: Diagnosis not present

## 2020-07-02 DIAGNOSIS — R7401 Elevation of levels of liver transaminase levels: Secondary | ICD-10-CM | POA: Diagnosis not present

## 2020-07-02 DIAGNOSIS — O134 Gestational [pregnancy-induced] hypertension without significant proteinuria, complicating childbirth: Principal | ICD-10-CM | POA: Diagnosis present

## 2020-07-02 DIAGNOSIS — O98313 Other infections with a predominantly sexual mode of transmission complicating pregnancy, third trimester: Secondary | ICD-10-CM | POA: Diagnosis not present

## 2020-07-02 DIAGNOSIS — O99324 Drug use complicating childbirth: Secondary | ICD-10-CM | POA: Diagnosis not present

## 2020-07-02 DIAGNOSIS — R030 Elevated blood-pressure reading, without diagnosis of hypertension: Secondary | ICD-10-CM | POA: Diagnosis present

## 2020-07-02 DIAGNOSIS — O99214 Obesity complicating childbirth: Secondary | ICD-10-CM | POA: Diagnosis present

## 2020-07-02 DIAGNOSIS — E119 Type 2 diabetes mellitus without complications: Secondary | ICD-10-CM | POA: Diagnosis not present

## 2020-07-02 DIAGNOSIS — O99213 Obesity complicating pregnancy, third trimester: Secondary | ICD-10-CM | POA: Diagnosis not present

## 2020-07-02 DIAGNOSIS — E282 Polycystic ovarian syndrome: Secondary | ICD-10-CM | POA: Diagnosis present

## 2020-07-02 DIAGNOSIS — B009 Herpesviral infection, unspecified: Secondary | ICD-10-CM | POA: Diagnosis not present

## 2020-07-02 DIAGNOSIS — O98513 Other viral diseases complicating pregnancy, third trimester: Secondary | ICD-10-CM | POA: Diagnosis present

## 2020-07-02 DIAGNOSIS — O133 Gestational [pregnancy-induced] hypertension without significant proteinuria, third trimester: Secondary | ICD-10-CM | POA: Diagnosis not present

## 2020-07-02 DIAGNOSIS — E669 Obesity, unspecified: Secondary | ICD-10-CM | POA: Diagnosis not present

## 2020-07-02 DIAGNOSIS — O99210 Obesity complicating pregnancy, unspecified trimester: Secondary | ICD-10-CM | POA: Diagnosis present

## 2020-07-02 DIAGNOSIS — O9852 Other viral diseases complicating childbirth: Secondary | ICD-10-CM | POA: Diagnosis not present

## 2020-07-02 DIAGNOSIS — O9081 Anemia of the puerperium: Secondary | ICD-10-CM | POA: Diagnosis not present

## 2020-07-02 DIAGNOSIS — A6000 Herpesviral infection of urogenital system, unspecified: Secondary | ICD-10-CM | POA: Diagnosis not present

## 2020-07-02 DIAGNOSIS — O9853 Other viral diseases complicating the puerperium: Secondary | ICD-10-CM | POA: Diagnosis not present

## 2020-07-02 DIAGNOSIS — Z3A37 37 weeks gestation of pregnancy: Secondary | ICD-10-CM

## 2020-07-02 DIAGNOSIS — Z98891 History of uterine scar from previous surgery: Secondary | ICD-10-CM | POA: Diagnosis not present

## 2020-07-02 DIAGNOSIS — O99215 Obesity complicating the puerperium: Secondary | ICD-10-CM | POA: Diagnosis not present

## 2020-07-02 DIAGNOSIS — Z3400 Encounter for supervision of normal first pregnancy, unspecified trimester: Secondary | ICD-10-CM

## 2020-07-02 DIAGNOSIS — O99824 Streptococcus B carrier state complicating childbirth: Secondary | ICD-10-CM | POA: Diagnosis present

## 2020-07-02 DIAGNOSIS — Z3403 Encounter for supervision of normal first pregnancy, third trimester: Secondary | ICD-10-CM | POA: Diagnosis not present

## 2020-07-02 DIAGNOSIS — O139 Gestational [pregnancy-induced] hypertension without significant proteinuria, unspecified trimester: Secondary | ICD-10-CM | POA: Diagnosis present

## 2020-07-02 DIAGNOSIS — O24113 Pre-existing diabetes mellitus, type 2, in pregnancy, third trimester: Secondary | ICD-10-CM | POA: Diagnosis not present

## 2020-07-02 LAB — COMPREHENSIVE METABOLIC PANEL
ALT: 56 U/L — ABNORMAL HIGH (ref 0–44)
AST: 37 U/L (ref 15–41)
Albumin: 3 g/dL — ABNORMAL LOW (ref 3.5–5.0)
Alkaline Phosphatase: 115 U/L (ref 38–126)
Anion gap: 11 (ref 5–15)
BUN: 8 mg/dL (ref 6–20)
CO2: 21 mmol/L — ABNORMAL LOW (ref 22–32)
Calcium: 9.1 mg/dL (ref 8.9–10.3)
Chloride: 104 mmol/L (ref 98–111)
Creatinine, Ser: 0.59 mg/dL (ref 0.44–1.00)
GFR, Estimated: >60 mL/min (ref >60)
Glucose, Bld: 125 mg/dL — ABNORMAL HIGH (ref 70–99)
Potassium: 3.6 mmol/L (ref 3.5–5.1)
Sodium: 136 mmol/L (ref 135–145)
Total Bilirubin: 0.9 mg/dL (ref 0.3–1.2)
Total Protein: 6.4 g/dL — ABNORMAL LOW (ref 6.5–8.1)

## 2020-07-02 LAB — GLUCOSE, CAPILLARY
Glucose-Capillary: 127 mg/dL — ABNORMAL HIGH (ref 70–99)
Glucose-Capillary: 81 mg/dL (ref 70–99)
Glucose-Capillary: 92 mg/dL (ref 70–99)

## 2020-07-02 LAB — PROTEIN / CREATININE RATIO, URINE
Creatinine, Urine: 73.79 mg/dL
Protein Creatinine Ratio: 0.12 mg/mg{Cre} (ref 0.00–0.15)
Total Protein, Urine: 9 mg/dL

## 2020-07-02 LAB — CBC
HCT: 31.8 % — ABNORMAL LOW (ref 36.0–46.0)
Hemoglobin: 10.8 g/dL — ABNORMAL LOW (ref 12.0–15.0)
MCH: 29.5 pg (ref 26.0–34.0)
MCHC: 34 g/dL (ref 30.0–36.0)
MCV: 86.9 fL (ref 80.0–100.0)
Platelets: 216 10*3/uL (ref 150–400)
RBC: 3.66 MIL/uL — ABNORMAL LOW (ref 3.87–5.11)
RDW: 12.8 % (ref 11.5–15.5)
WBC: 6.5 10*3/uL (ref 4.0–10.5)
nRBC: 0 % (ref 0.0–0.2)

## 2020-07-02 LAB — RESP PANEL BY RT-PCR (FLU A&B, COVID) ARPGX2
Influenza A by PCR: NEGATIVE
Influenza B by PCR: NEGATIVE
SARS Coronavirus 2 by RT PCR: POSITIVE — AB

## 2020-07-02 MED ORDER — OXYCODONE-ACETAMINOPHEN 5-325 MG PO TABS: 2.0000 | ORAL_TABLET | ORAL | Status: DC | PRN

## 2020-07-02 MED ORDER — SODIUM CHLORIDE 0.9 % IV SOLN
5.0000 10*6.[IU] | Freq: Once | INTRAVENOUS | Status: AC
  Administered 2020-07-02: 5 10*6.[IU] via INTRAVENOUS
  Filled 2020-07-02: qty 5

## 2020-07-02 MED ORDER — OXYTOCIN-SODIUM CHLORIDE 30-0.9 UT/500ML-% IV SOLN: 2.5000 [IU]/h | INTRAVENOUS | Status: DC

## 2020-07-02 MED ORDER — OXYTOCIN BOLUS FROM INFUSION: 333.0000 mL | Freq: Once | INTRAVENOUS | Status: DC

## 2020-07-02 MED ORDER — LACTATED RINGERS IV SOLN: INTRAVENOUS | Status: DC

## 2020-07-02 MED ORDER — ONDANSETRON HCL 4 MG/2ML IJ SOLN
4.0000 mg | Freq: Four times a day (QID) | INTRAMUSCULAR | Status: DC | PRN
  Administered 2020-07-03 – 2020-07-05 (×4): 4 mg via INTRAVENOUS
  Filled 2020-07-02 (×4): qty 2

## 2020-07-02 MED ORDER — LACTATED RINGERS IV SOLN
500.0000 mL | INTRAVENOUS | Status: DC | PRN
  Administered 2020-07-05: 1000 mL via INTRAVENOUS

## 2020-07-02 MED ORDER — OXYTOCIN-SODIUM CHLORIDE 30-0.9 UT/500ML-% IV SOLN
1.0000 m[IU]/min | INTRAVENOUS | Status: DC
  Administered 2020-07-03 – 2020-07-04 (×2): 2 m[IU]/min via INTRAVENOUS
  Administered 2020-07-05: 16 m[IU]/min via INTRAVENOUS
  Filled 2020-07-02 (×2): qty 500

## 2020-07-02 MED ORDER — SOD CITRATE-CITRIC ACID 500-334 MG/5ML PO SOLN: 30.0000 mL | ORAL | Status: DC | PRN

## 2020-07-02 MED ORDER — FENTANYL CITRATE (PF) 100 MCG/2ML IJ SOLN
100.0000 ug | INTRAMUSCULAR | Status: DC | PRN
  Administered 2020-07-03 (×2): 100 ug via INTRAVENOUS
  Filled 2020-07-02 (×2): qty 2

## 2020-07-02 MED ORDER — LIDOCAINE HCL (PF) 1 % IJ SOLN: 30.0000 mL | INTRAMUSCULAR | Status: DC | PRN

## 2020-07-02 MED ORDER — PENICILLIN G POT IN DEXTROSE 60000 UNIT/ML IV SOLN
3.0000 10*6.[IU] | INTRAVENOUS | Status: DC
  Administered 2020-07-02 – 2020-07-05 (×16): 3 10*6.[IU] via INTRAVENOUS
  Filled 2020-07-02 (×17): qty 50

## 2020-07-02 MED ORDER — ZOLPIDEM TARTRATE 5 MG PO TABS: 5.0000 mg | ORAL_TABLET | Freq: Every evening | ORAL | Status: DC | PRN

## 2020-07-02 MED ORDER — OXYCODONE-ACETAMINOPHEN 5-325 MG PO TABS: 1.0000 | ORAL_TABLET | ORAL | Status: DC | PRN

## 2020-07-02 MED ORDER — TERBUTALINE SULFATE 1 MG/ML IJ SOLN: 0.2500 mg | Freq: Once | INTRAMUSCULAR | Status: DC | PRN

## 2020-07-02 MED ORDER — ACETAMINOPHEN 325 MG PO TABS: 650.0000 mg | ORAL_TABLET | ORAL | Status: DC | PRN

## 2020-07-02 MED ORDER — MISOPROSTOL 50MCG HALF TABLET
50.0000 ug | ORAL_TABLET | ORAL | Status: DC | PRN
  Administered 2020-07-02 – 2020-07-03 (×5): 50 ug via ORAL
  Filled 2020-07-02 (×6): qty 1

## 2020-07-02 NOTE — Progress Notes (Signed)
   TELEHEALTH OBSTETRICS VISIT ENCOUNTER NOTE  Provider location: Center for [employer] at [place]   I connected with Andrea Black on 07/02/20 at  9:35 AM EST by telephone at home and verified that I am speaking with the correct person using two identifiers.   I discussed the limitations, risks, security and privacy concerns of performing an evaluation and management service by telephone and the availability of in person appointments. I also discussed with the patient that there may be a patient responsible charge related to this service. The patient expressed understanding and agreed to proceed.  Subjective:  Andrea Black is a 25 y.o. G1P0000 at [redacted]w[redacted]d being followed for ongoing prenatal care.  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  Patient reports headache. Reports fetal movement. Denies any contractions, bleeding or leaking of fluid.   The following portions of the patient's history were reviewed and updated as appropriate: allergies, current medications, past family history, past medical history, past social history, past surgical history and problem list.   Objective:   General:  Alert, oriented and cooperative.   Mental Status: Normal mood and affect perceived. Normal judgment and thought content.  Rest of physical exam deferred due to type of encounter  Assessment and Plan:  Pregnancy: G1P0000 at [redacted]w[redacted]d 1. Group B streptococcal bacteriuria  Will treat in labor   2.  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 Encounter for supervision of normal first pregnancy in third trimester  First BP check @ 9:30 168/93 2nd BP check @ 9:48 189/106  + HA  Discussed direct admit with Dr. Shawnie Pons and Donette Larry CNM. Orders placed. Patient is aware she should go to [facility] now for direct admit.      Term labor symptoms and general obstetric precautions including but not limited to vaginal bleeding, contractions, leaking of fluid and fetal movement were reviewed in detail with the patient.  I discussed the assessment and treatment plan with the patient. The patient was provided an opportunity to ask questions and all were answered. The patient agreed with the plan and demonstrated an understanding of the instructions. The patient was advised to call back or seek an in-person office evaluation/go to [facility] at [facility] for any urgent or concerning symptoms. Please refer to After Visit Summary for other counseling recommendations.   I provided 11 minutes of non-face-to-face time during this encounter.  No follow-ups on file.  Future Appointments  Date Time Provider Department Center  07/09/2020  9:00 AM Constant, Gigi Gin, MD [facility] None  07/09/2020  1:00 PM [facility] NURSE [facility]  07/09/2020  1:15 PM [facility] NST [facility]  07/15/2020  9:45 AM Warden Fillers, MD [facility] None  07/24/2020  9:45 AM Warden Fillers, MD [facility] None    Venia Carbon, NP Center for [facility], [facility]

## 2020-07-02 NOTE — Progress Notes (Signed)
Vtx confirmed by [facility]. +Covid, asymptomatic. Discussed plan of care. All questions answered.

## 2020-07-02 NOTE — Progress Notes (Signed)
Patient Vitals for the past 4 hrs:  BP Temp Temp src Pulse Resp SpO2  07/02/20 1945 (!) 129/91 98.9 F (37.2 C) Oral 71 18 97 %  07/02/20 1839 137/85 -- -- 66 18 --  07/02/20 1739 122/82 98.8 F (37.1 C) Oral 63 16 --   Wincing some w/ctx.  Foley still in.  BS 81. Cx closed/thick/-2 per RN>  FHR Cat 1. Irregular (q 1-5 minute ctx).  2nd cytotec given.  Plan cytotec until cx >1cm d/t difficulty tolerating vaginal exams.

## 2020-07-02 NOTE — H&P (Addendum)
OBSTETRIC ADMISSION HISTORY AND PHYSICAL  Andrea Black is a 25 y.o. female G1P0000 with IUP at 78w1dby LMP c/w early [place]ing for IOL for HTN and poorly controlled DM. She reports +FMs, No LOF, no VB, no blurry vision, headaches or peripheral edema, and RUQ pain. She did endorse headaches prior to her office visit today. She plans on breast feeding. Unsure of plans for birth control.  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 Anemia   . Depression   . Diabetes mellitus without complication ([facility]    pre-diabetic   . Headache in pregnancy, antepartum, second trimester 02/12/2020  . Polycystic ovary     Past Surgical History: Past Surgical History:  Procedure Laterality Date  . WISDOM TOOTH EXTRACTION      Obstetrical History: OB History    Gravida  1   Para  0   Term  0   Preterm  0   AB  0   Living  0     SAB  0   IAB  0   Ectopic  0   Multiple  0   Live Births              Social History Social History   Socioeconomic History  . Marital status: Married    Spouse name: Not on file  . Number of children: Not on file  . Years of education: Not on file  . Highest education level: Not on file  Occupational History  . Occupation: [employer]  Tobacco Use  . Smoking status: Former Smoker    Types: Cigarettes    Quit date: 11/04/2019    Years since quitting: 0.6  . Smokeless tobacco: Never Used  Vaping Use  . Vaping Use: Never used  Substance and Sexual Activity  . Alcohol use: No     Alcohol/week: 0.0 standard drinks  . Drug use: Yes    Types: Marijuana  . Sexual activity: Yes    Birth control/protection: None  Other Topics Concern  . Not on file  Social History Narrative  . Not on file   Social Determinants of Health   Financial Resource Strain: Not on file  Food Insecurity: Not on file  Transportation Needs: Not on file  Physical Activity: Not on file  Stress: Not on file  Social Connections: Not on file    Family History: Family History  Problem Relation Age of Onset  . Colon cancer Mother   . Diabetes Mother   . Arthritis Mother   . Cancer Mother   . Depression Mother   . Stroke Mother   . Heart disease Father   . Asthma Father   . Hypertension Father   . Polycystic ovary syndrome Paternal Grandmother   . Alcohol abuse Brother     Allergies: No Known Allergies  Pt denies allergies to latex, iodine, or shellfish.  Medications Prior to Admission  Medication Sig Dispense Refill Last Dose  . Accu-Chek Softclix Lancets lancets 1 each by Other route 4 (four) times daily. 100 each 12   . Acetaminophen (TYLENOL PO) Take  by mouth.     Marland Kitchen aspirin EC 81 MG tablet Take 1 tablet (81 mg total) by mouth daily. Swallow whole. (Patient not taking: No sig reported) 30 tablet 11   . Blood Glucose Monitoring Suppl (ACCU-CHEK GUIDE) w/Device KIT 1 Device by Does not apply route 4 (four) times daily. 1 kit 0   . Blood Pressure Monitoring (BLOOD PRESSURE KIT) DEVI 1 kit by Does not apply route once a week. Check Blood Pressure regularly and record readings into the Bab[employer] App.  Large Cuff.  DX O90.0 1 each 0   . Butalbital-APAP-Caffeine 50-325-40 MG capsule Take 1-2 capsules by mouth every 6 (six) hours as needed for headache. 30 capsule 3   . Continuous Blood Gluc Receiver DEVI 1 Device by Does not apply route 4 (four) times daily as needed (fasting and 2 hours after meals). 1 Device 0   . Continuous Blood Gluc Sensor MISC 1 each by Does not apply route as  directed. Use as directed every 14 days. May dispense FreeStyle [employer] or similar. 1 each 16   . Elastic Bandages & Supports (COMFORT FIT MATERNITY SUPP MED) MISC Wear daily when ambulating 1 each 0   . glucose blood (ACCU-CHEK GUIDE) test strip Use to check blood sugars four times a day was instructed 50 each 12   . insulin glargine (LANTUS) 100 UNIT/ML injection Inject 0.2 mLs (20 Units total) into the skin daily. (Patient not taking: No sig reported) 10 mL 11   . insulin lispro (HUMALOG) 100 UNIT/ML injection Inject 0.04 mLs (4 Units total) into the skin 3 (three) times daily before meals. (Patient not taking: No sig reported) 10 mL 11   . metFORMIN (GLUCOPHAGE) 500 MG tablet Take 1 tablet (500 mg total) by mouth at bedtime. (Patient taking differently: Take 500 mg by mouth at bedtime.) 30 tablet 5   . Prenatal Vit-Fe Fumarate-FA (MULTIVITAMIN-PRENATAL) 27-0.8 MG TABS tablet Take 1 tablet by mouth daily at 12 noon.     Marland Kitchen terconazole (TERAZOL 7) 0.4 % vaginal cream Place 1 applicator vaginally at bedtime. (Patient not taking: No sig reported) 45 g 0   . valACYclovir (VALTREX) 500 MG tablet Take 1 tablet (500 mg total) by mouth 2 (two) times daily. Take for 3 days as needed for each recurrence. 30 tablet 11      Review of Systems   All systems reviewed and negative except as stated in HPI  Blood pressure 138/85, pulse 83, temperature 98.5 F (36.9 C), temperature source Oral, resp. rate 18, height '5\' 2"'  (1.575 m), weight 94.9 kg, last menstrual period 10/16/2019. General appearance: alert, cooperative and no distress Lungs: clear to auscultation bilaterally Heart: regular rate and rhythm Abdomen: soft, non-tender; bowel sounds normal Extremities:  no sign of DVT Presentation: cephalic Fetal monitoringBaseline: 150 bpm, Variability: Good {> 6 bpm), Accelerations: Reactive and Decelerations: Absent Uterine activityFrequency: Every 2-3 minutes  VE: closed/thick; no lesions  visualized  Prenatal labs: ABO, Rh: O/Positive/-- (07/08 1129) Antibody: Negative (07/08 1129) Rubella: 1.00 (07/08 1129) RPR: Non Reactive (07/08 1129)  HBsAg: Negative (07/08 1129)  HIV: Non Reactive (07/08 1129)  GBS:  Pos 1 hr Glucola: pre existing DM. A1c 9.7 Genetic screening:  wnl  Anatomy US: normal  Prenatal Transfer Tool  Maternal Diabetes: Yes:  Diabetes Type:  Insulin/Medication controlled (Has not started insulin. Taking metformin)  Genetic Screening: Normal Maternal Ultrasounds/Referrals: Normal Fetal Ultrasounds or other Referrals:  None Maternal Substance Abuse:  No Significant Maternal Medications:  Meds include: Other:  Metformin  Significant Maternal Lab Results: Group B Strep positive COVID positive   Results for orders placed or performed during the hospital encounter of 07/02/20 (from the past 24 hour(s))  Glucose, capillary   Collection Time: 07/02/20  1:32 PM  Result Value Ref Range   Glucose-Capillary 127 (H) 70 - 99 mg/dL   Comment 1 Notify RN    Comment 2 Document in Chart     Patient Active Problem List   Diagnosis Date Noted  . Elevated BP without diagnosis of hypertension 07/02/2020  . [redacted] weeks gestation of pregnancy 06/01/2020  . [redacted] weeks gestation of pregnancy 04/15/2020  . Shoulder pain, right 04/15/2020  . Headache in pregnancy, antepartum, second trimester 02/12/2020  . Group B streptococcal bacteriuria 01/06/2020  . Pre-existing diabetes mellitus affecting pregnancy in third trimester, antepartum 01/03/2020  . Supervision of normal first pregnancy 11/14/2019  . Exposure to sexually transmitted disease (STD) 02/15/2017  . Polycystic ovarian disease 04/14/2016  . Obesity affecting pregnancy, antepartum 04/01/2016  . Dysmenorrhea 04/01/2016    Assessment/Plan:  Andrea Black is a 25 y.o. G1P0000 at 83w1dhere for induction of labor due to gHTN and uncontrolled DM.   #Pain: Unsure at this time but she would like to re assess as  she progresses  #FWB: Category 1 strip. Moderate variability. Accelerations present, no decelerations  #ID:  GBS+, COVID + #MOF: breast #MOC: unsure  #Circ:  Yes   [facility]2020, 1:48 PM  Midwife attestation: I have seen and examined this patient; I agree with above documentation in the resident's note.   PE: Gen: calm comfortable, NAD Resp: normal effort and rate Abd: gravid  ROS, labs, PMH reviewed  Assessment/Plan: [redacted] weeks gestation Labor: latent FWB: Cat I GBS: pos Admit to LD PEC labs Cervical ripening CBGs Anticipate SVD  MJulianne Handler CNM  07/02/2020, 5:16 PM

## 2020-07-02 NOTE — Progress Notes (Signed)
Virtual ROB 37 wks  CC: Pain and pressure, Braxton hicks ctxs   Pt able to check B/P while on phone Notes stopped baby aspirin a few months ago.  Pt able to check B/P while on the phone   **B/P:168/93  *P:57  Pt states she had a HA a few hrs ago not at this time.  Pt has a log of blood sugars to discuss with provider.

## 2020-07-03 LAB — COMPREHENSIVE METABOLIC PANEL
ALT: 82 U/L — ABNORMAL HIGH (ref 0–44)
AST: 71 U/L — ABNORMAL HIGH (ref 15–41)
Albumin: 2.6 g/dL — ABNORMAL LOW (ref 3.5–5.0)
Alkaline Phosphatase: 109 U/L (ref 38–126)
Anion gap: 12 (ref 5–15)
BUN: 5 mg/dL — ABNORMAL LOW (ref 6–20)
CO2: 20 mmol/L — ABNORMAL LOW (ref 22–32)
Calcium: 8.6 mg/dL — ABNORMAL LOW (ref 8.9–10.3)
Chloride: 102 mmol/L (ref 98–111)
Creatinine, Ser: 0.61 mg/dL (ref 0.44–1.00)
GFR, Estimated: >60 mL/min (ref >60)
Glucose, Bld: 96 mg/dL (ref 70–99)
Potassium: 3.9 mmol/L (ref 3.5–5.1)
Sodium: 134 mmol/L — ABNORMAL LOW (ref 135–145)
Total Bilirubin: 1.7 mg/dL — ABNORMAL HIGH (ref 0.3–1.2)
Total Protein: 6 g/dL — ABNORMAL LOW (ref 6.5–8.1)

## 2020-07-03 LAB — GLUCOSE, CAPILLARY
Glucose-Capillary: 75 mg/dL (ref 70–99)
Glucose-Capillary: 63 mg/dL — ABNORMAL LOW (ref 70–99)
Glucose-Capillary: 88 mg/dL (ref 70–99)
Glucose-Capillary: 74 mg/dL (ref 70–99)
Glucose-Capillary: 87 mg/dL (ref 70–99)
Glucose-Capillary: 93 mg/dL (ref 70–99)

## 2020-07-03 LAB — CBC
HCT: 27.4 % — ABNORMAL LOW (ref 36.0–46.0)
HCT: 28.5 % — ABNORMAL LOW (ref 36.0–46.0)
Hemoglobin: 9.9 g/dL — ABNORMAL LOW (ref 12.0–15.0)
Hemoglobin: 9.9 g/dL — ABNORMAL LOW (ref 12.0–15.0)
MCH: 30.7 pg (ref 26.0–34.0)
MCH: 30.2 pg (ref 26.0–34.0)
MCHC: 36.1 g/dL — ABNORMAL HIGH (ref 30.0–36.0)
MCHC: 34.7 g/dL (ref 30.0–36.0)
MCV: 85.1 fL (ref 80.0–100.0)
MCV: 86.9 fL (ref 80.0–100.0)
Platelets: 219 10*3/uL (ref 150–400)
Platelets: 219 10*3/uL (ref 150–400)
RBC: 3.22 MIL/uL — ABNORMAL LOW (ref 3.87–5.11)
RBC: 3.28 MIL/uL — ABNORMAL LOW (ref 3.87–5.11)
RDW: 12.7 % (ref 11.5–15.5)
RDW: 12.7 % (ref 11.5–15.5)
WBC: 7.6 10*3/uL (ref 4.0–10.5)
WBC: 7.3 10*3/uL (ref 4.0–10.5)
nRBC: 0 % (ref 0.0–0.2)
nRBC: 0 % (ref 0.0–0.2)

## 2020-07-03 LAB — RPR: RPR Ser Ql: NONREACTIVE

## 2020-07-03 MED ORDER — PHENYLEPHRINE 40 MCG/ML (10ML) SYRINGE FOR IV PUSH (FOR BLOOD PRESSURE SUPPORT): 80.0000 ug | PREFILLED_SYRINGE | INTRAVENOUS | Status: DC | PRN

## 2020-07-03 MED ORDER — FENTANYL-BUPIVACAINE-NACL 0.5-0.125-0.9 MG/250ML-% EP SOLN
12.0000 mL/h | EPIDURAL | Status: DC | PRN
  Administered 2020-07-04 – 2020-07-05 (×2): 12 mL/h via EPIDURAL
  Filled 2020-07-03 (×3): qty 250

## 2020-07-03 MED ORDER — OXYTOCIN-SODIUM CHLORIDE 30-0.9 UT/500ML-% IV SOLN: 1.0000 m[IU]/min | INTRAVENOUS | Status: DC

## 2020-07-03 MED ORDER — EPHEDRINE 5 MG/ML INJ: 10.0000 mg | INTRAVENOUS | Status: DC | PRN

## 2020-07-03 MED ORDER — TERBUTALINE SULFATE 1 MG/ML IJ SOLN
0.2500 mg | Freq: Once | INTRAMUSCULAR | Status: DC | PRN
Start: 2020-07-03 — End: 2020-07-05

## 2020-07-03 MED ORDER — LACTATED RINGERS IV SOLN: 500.0000 mL | Freq: Once | INTRAVENOUS | Status: DC

## 2020-07-03 MED ORDER — DIPHENHYDRAMINE HCL 50 MG/ML IJ SOLN: 12.5000 mg | INTRAMUSCULAR | Status: DC | PRN

## 2020-07-03 NOTE — Progress Notes (Addendum)
Labor Progress Note Andrea Black is a 25 y.o. G1P0000 at [redacted]w[redacted]d presented for IOL for uncontrolled pre-existing diabetes and gHTN.  S: Patient is resting comfortably. Pain is controlled. She states she wants an epidural at the last minute possible. She tells me that her blood sugars and blood pressure have all been good. Denies any complaints.   O:  BP 129/81   Pulse 65   Temp 98.7 F (37.1 C) (Oral)   Resp 18   Ht 5\' 2"  (1.575 m)   Wt 94.9 kg   LMP 10/16/2019 (Exact Date)   SpO2 98%   BMI 38.28 kg/m  EFM: baseline HR 140 /mod variability/accels present, no decels   CVE: deferred   A&P: 25 y.o. G1P0000 [redacted]w[redacted]d  #Labor: latent, progressing slowly. Received Pit 2x2 started at 1350 #Pain: analgesia prn. Requests epidural prior to delivery  #FWB: category 1 strip #GBS positive  #COVID +  #T2DM: stable, CBGs 70-80s #GHTN: stable  [place] [facility] [employer] 3:48 PM  Midwife attestation I agree with the documentation in the resident's note.   Julianne Handler, CNM 5:08 PM

## 2020-07-03 NOTE — Progress Notes (Signed)
Patient Vitals for the past 4 hrs:  BP Pulse Resp  07/02/20 2130 131/84 70 18   A little uncomfortable w/ctx.   FHR Cat 1.  Irregular ctx q 2-4 minutes.  CBG 92. 02 sat 97%.  3rd cytotec given orally.  Continue present mgt.

## 2020-07-03 NOTE — Progress Notes (Signed)
Labor Progress Note Andrea Black is a 25 y.o. G1P0000 at [redacted]w[redacted]d presented for IOL for uncontrolled pre-existing diabetes and gHTN.  S: Doing well without complaints.  O:  BP 137/78   Pulse (!) 52   Temp 98.7 F (37.1 C) (Oral)   Resp 18   Ht 5\' 2"  (1.575 m)   Wt 94.9 kg   LMP 10/16/2019 (Exact Date)   SpO2 98%   BMI 38.28 kg/m  EFM: baseline HR 150 /mod variability/accels present, no decels  Toco: difficult to trace  Dilation: 5 Effacement (%): 50 Station: -2 Presentation: Vertex Exam by:: Dr. Sylvester Harder   A&P: 25 y.o. G1P0000 [redacted]w[redacted]d  #IOL: S/p cyto and FB. Pit started at 1350, continue to titrate. Discussed AROM risks/benefits but given G1 and fetal station elected to defer to next exam. #Pain: analgesia prn. Requests epidural prior to delivery  #FWB: category 1 strip #GBS positive , PCN, adequate prophylaxis #COVID +: airborne precautions #T2DM: stable, continue q4 monitoring during latent labor, and increase to q2 during active labor. Re-start metformin postpartum. EFW 89%ile with AC >99%ile, SD precautions at delivery. #GHTN: asymptomatic. No severe range BP. Will obtain repeat preE labs, no p/c since admission. Continue to monitor. #HSV: on suppression, no lesions noted on external exam.  Arrie Senate, MD [facility] [place] 9:41 PM

## 2020-07-03 NOTE — Progress Notes (Signed)
Labor Progress Note Leland Staszewski is a 25 y.o. G1P0000 at [redacted]w[redacted]d presented for IOL for uncontrolled pre-existing diabetes and gHTN.  S:  Feeling comfortable, pre-medicated for FB attempt d/t intolerance of exams.  O:  BP (!) 144/85 (BP Location: Left Arm)   Pulse (!) 56   Temp 98.7 F (37.1 C) (Oral)   Resp 18   Ht 5\' 2"  (1.575 m)   Wt 94.9 kg   LMP 10/16/2019 (Exact Date)   SpO2 98%   BMI 38.28 kg/m  EFM: baseline 135 bpm/ mod variability/ + accels/ no decels  Toco/IUPC: 1-4 SVE: Dilation: 1 Effacement (%): 60 Station: -3 Presentation: Vertex Exam by:: Colman Cater, CNM CBG: 88  A/P: 25 y.o. G1P0000 [redacted]w[redacted]d  1. Labor: latent 2. FWB: Cat I 3. Pain: analgesia prn 4. T2DM: stable 5. GHTN: stable  Consented for FB, placed w/o difficulty, pt tolerated fairly well. Continue Cytotec. Anticipate labor progress and SVD.  Julianne Handler, CNM 9:36 AM

## 2020-07-04 ENCOUNTER — Inpatient Hospital Stay (HOSPITAL_COMMUNITY): Payer: Medicaid Other | Admitting: Anesthesiology

## 2020-07-04 DIAGNOSIS — R7401 Elevation of levels of liver transaminase levels: Secondary | ICD-10-CM

## 2020-07-04 LAB — GLUCOSE, CAPILLARY
Glucose-Capillary: 87 mg/dL (ref 70–99)
Glucose-Capillary: 80 mg/dL (ref 70–99)
Glucose-Capillary: 88 mg/dL (ref 70–99)
Glucose-Capillary: 62 mg/dL — ABNORMAL LOW (ref 70–99)
Glucose-Capillary: 74 mg/dL (ref 70–99)
Glucose-Capillary: 54 mg/dL — ABNORMAL LOW (ref 70–99)
Glucose-Capillary: 58 mg/dL — ABNORMAL LOW (ref 70–99)
Glucose-Capillary: 155 mg/dL — ABNORMAL HIGH (ref 70–99)
Glucose-Capillary: 53 mg/dL — ABNORMAL LOW (ref 70–99)

## 2020-07-04 LAB — COMPREHENSIVE METABOLIC PANEL
ALT: 85 U/L — ABNORMAL HIGH (ref 0–44)
AST: 74 U/L — ABNORMAL HIGH (ref 15–41)
Albumin: 2.8 g/dL — ABNORMAL LOW (ref 3.5–5.0)
Alkaline Phosphatase: 116 U/L (ref 38–126)
Anion gap: 12 (ref 5–15)
BUN: 6 mg/dL (ref 6–20)
CO2: 18 mmol/L — ABNORMAL LOW (ref 22–32)
Calcium: 8.7 mg/dL — ABNORMAL LOW (ref 8.9–10.3)
Chloride: 106 mmol/L (ref 98–111)
Creatinine, Ser: 0.79 mg/dL (ref 0.44–1.00)
GFR, Estimated: >60 mL/min (ref >60)
Glucose, Bld: 81 mg/dL (ref 70–99)
Potassium: 3.6 mmol/L (ref 3.5–5.1)
Sodium: 136 mmol/L (ref 135–145)
Total Bilirubin: 1.8 mg/dL — ABNORMAL HIGH (ref 0.3–1.2)
Total Protein: 6 g/dL — ABNORMAL LOW (ref 6.5–8.1)

## 2020-07-04 LAB — CBC
HCT: 31.6 % — ABNORMAL LOW (ref 36.0–46.0)
Hemoglobin: 10.8 g/dL — ABNORMAL LOW (ref 12.0–15.0)
MCH: 29.9 pg (ref 26.0–34.0)
MCHC: 34.2 g/dL (ref 30.0–36.0)
MCV: 87.5 fL (ref 80.0–100.0)
Platelets: 243 10*3/uL (ref 150–400)
RBC: 3.61 MIL/uL — ABNORMAL LOW (ref 3.87–5.11)
RDW: 12.8 % (ref 11.5–15.5)
WBC: 9.1 10*3/uL (ref 4.0–10.5)
nRBC: 0 % (ref 0.0–0.2)

## 2020-07-04 LAB — PROTEIN / CREATININE RATIO, URINE
Creatinine, Urine: 53.86 mg/dL
Protein Creatinine Ratio: 0.24 mg/mg{Cre} — ABNORMAL HIGH (ref 0.00–0.15)
Total Protein, Urine: 13 mg/dL

## 2020-07-04 MED ORDER — LABETALOL HCL 5 MG/ML IV SOLN: 20.0000 mg | INTRAVENOUS | Status: DC | PRN

## 2020-07-04 MED ORDER — HYDRALAZINE HCL 20 MG/ML IJ SOLN: 10.0000 mg | INTRAMUSCULAR | Status: DC | PRN

## 2020-07-04 MED ORDER — DEXTROSE 50 % IV SOLN
INTRAVENOUS | Status: AC
  Filled 2020-07-04: qty 50

## 2020-07-04 MED ORDER — LABETALOL HCL 5 MG/ML IV SOLN: 40.0000 mg | INTRAVENOUS | Status: DC | PRN

## 2020-07-04 MED ORDER — LABETALOL HCL 5 MG/ML IV SOLN
INTRAVENOUS | Status: AC
  Filled 2020-07-04: qty 4

## 2020-07-04 MED ORDER — SODIUM CHLORIDE (PF) 0.9 % IJ SOLN
INTRAMUSCULAR | Status: DC | PRN
  Administered 2020-07-04: 12 mL/h via EPIDURAL

## 2020-07-04 MED ORDER — DEXTROSE 50 % IV SOLN
1.0000 | Freq: Once | INTRAVENOUS | Status: AC
  Administered 2020-07-04: 50 mL via INTRAVENOUS

## 2020-07-04 MED ORDER — LABETALOL HCL 5 MG/ML IV SOLN: 80.0000 mg | INTRAVENOUS | Status: DC | PRN

## 2020-07-04 MED ORDER — LIDOCAINE HCL (PF) 1 % IJ SOLN
INTRAMUSCULAR | Status: DC | PRN
  Administered 2020-07-04: 5 mL via EPIDURAL

## 2020-07-04 NOTE — Progress Notes (Signed)
Per Evonnie Dawes (day shift RN on 07/04/2019), patients foley bulb came out around 1300 on 07/04/2019.

## 2020-07-04 NOTE — Anesthesia Preprocedure Evaluation (Addendum)
Anesthesia Evaluation  Patient identified by MRN, date of birth, ID band Patient awake    Reviewed: Allergy & Precautions, Patient's Chart, lab work & pertinent test results  Airway Mallampati: II  TM Distance: >3 FB Neck ROM: Full    Dental no notable dental hx. (+) Teeth Intact, Dental Advisory Given   Pulmonary former smoker,  + covid   Pulmonary exam normal breath sounds clear to auscultation       Cardiovascular Exercise Tolerance: Good hypertension, Pt. on medications Normal cardiovascular exam Rhythm:Regular Rate:Normal     Neuro/Psych  Headaches, PSYCHIATRIC DISORDERS    GI/Hepatic negative GI ROS, Neg liver ROS, Lab Results      Component                Value               Date                      ALT                      82 (H)              07/03/2020                AST                      71 (H)              07/03/2020                ALKPHOS                  109                 07/03/2020                BILITOT                  1.7 (H)             07/03/2020              Endo/Other  diabetes  Renal/GU negative Renal ROS     Musculoskeletal negative musculoskeletal ROS (+)   Abdominal (+) + obese,   Peds  Hematology  (+) anemia , Lab Results      Component                Value               Date                      WBC                      7.3                 07/03/2020                HGB                      9.9 (L)             07/03/2020                HCT                      28.5 (L)  07/03/2020                MCV                      86.9                07/03/2020                PLT                      219                 07/03/2020              Anesthesia Other Findings   Reproductive/Obstetrics (+) Pregnancy                             Anesthesia Physical Anesthesia Plan  ASA: III  Anesthesia Plan: Epidural   Post-op Pain Management:     Induction:   PONV Risk Score and Plan:   Airway Management Planned:   Additional Equipment:   Intra-op Plan:   Post-operative Plan:   Informed Consent: I have reviewed the patients History and Physical, chart, labs and discussed the procedure including the risks, benefits and alternatives for the proposed anesthesia with the patient or authorized representative who has indicated his/her understanding and acceptance.       Plan Discussed with: CRNA  Anesthesia Plan Comments: (G1P0 w gdm , PIH  For LEA)        Anesthesia Quick Evaluation

## 2020-07-04 NOTE — Progress Notes (Signed)
Labor Progress Note Andrea Black is a 25 y.o. G1P0000 at [redacted]w[redacted]d presented for IOL for uncontrolled pre-existing diabetes and gHTN.  S: Doing well without complaints.  O:  BP 138/84   Pulse (!) 56   Temp 98.5 F (36.9 C) (Oral)   Resp 18   Ht 5\' 2"  (1.575 m)   Wt 94.9 kg   LMP 10/16/2019 (Exact Date)   SpO2 100%   BMI 38.28 kg/m  EFM: baseline 145 bpm /mod variability/accels present, no decels  Toco: minimal uterine activity s/p discontinuation of pitocin  Dilation: 5.5 Effacement (%): 80 Station: Plus 1 Presentation: Vertex Exam by:: Dr. Astrid Drafts   A&P: 25 y.o. G1P0000 [redacted]w[redacted]d  #IOL: S/p cyto and FB. Pit started at 1350 on 07/03/20. AROM for clear fluid at 1345. Given minimal cervical change since 2130 on 07/03/20 despite AROM, discontinued pitocin at 1800. Patient's cervical exam unchanged since discontinuation of pitocin, as expected. Will restart pitocin and continue to titrate. Will recheck in 4 hours. IUPC replaced at this check. #Pain: epidural #FWB: category 1 #GBS positive , PCN, s/p adequate prophylaxis #COVID +: airborne precautions #T2DM: stable, continue q4 monitoring during latent labor, and increase to q2 during active labor. Plan to re-start metformin postpartum. EFW 89%ile with AC >99%ile, SD precautions at delivery. #GHTN: Asymptomatic. No persistent severe range Bps. Repeat preE labs unremarkable, except for elevated LFTs. Continue to monitor. #HSV: on suppression, no lesions noted on external exam. #Transaminitis: ALT elevated on admission 56-->82 on recheck. AST initially 37-->82 on recheck. Likely in the setting of COVID.   Arrie Senate, MD [facility] 10:51 PM

## 2020-07-04 NOTE — Progress Notes (Signed)
Patient's blood glucose 58, 54, 53 after oral intake. Patient is vomiting, prn zofran given. Asymptomatic. Received verbal order from Dr. Sylvester Harder for one time dose D50.

## 2020-07-04 NOTE — Anesthesia Procedure Notes (Signed)
Epidural Patient location during procedure: OB Start time: 07/04/2020 2:10 AM End time: 07/04/2020 2:28 AM  Staffing Anesthesiologist: Barnet Glasgow, MD Performed: anesthesiologist   Preanesthetic Checklist Completed: patient identified, IV checked, site marked, risks and benefits discussed, surgical consent, monitors and equipment checked, pre-op evaluation and timeout performed  Epidural Patient position: sitting Prep: DuraPrep and site prepped and draped Patient monitoring: continuous pulse ox and blood pressure Approach: midline Location: L3-L4 Injection technique: LOR air  Needle:  Needle type: Tuohy  Needle gauge: 17 G Needle length: 9 cm and 9 Needle insertion depth: 8 cm Catheter type: closed end flexible Catheter size: 19 Gauge Catheter at skin depth: 13 cm Test dose: negative  Assessment Events: blood not aspirated, injection not painful, no injection resistance, no paresthesia and negative IV test  Additional Notes Patient identified. Risks/Benefits/Options discussed with patient including but not limited to bleeding, infection, nerve damage, paralysis, failed block, incomplete pain control, headache, blood pressure changes, nausea, vomiting, reactions to medication both or allergic, itching and postpartum back pain. Confirmed with bedside nurse the patient's most recent platelet count. Confirmed with patient that they are not currently taking any anticoagulation, have any bleeding history or any family history of bleeding disorders. Patient expressed understanding and wished to proceed. All questions were answered. Sterile technique was used throughout the entire procedure. Please see nursing notes for vital signs. Test dose was given through epidural needle and negative prior to continuing to dose epidural or start infusion. Warning signs of high block given to the patient including shortness of breath, tingling/numbness in hands, complete motor block, or any concerning  symptoms with instructions to call for help. Patient was given instructions on fall risk and not to get out of bed. All questions and concerns addressed with instructions to call with any issues. 1 Attempt (S) . Patient tolerated procedure well.

## 2020-07-04 NOTE — Progress Notes (Signed)
Labor Progress Note Andrea Black is a 25 y.o. G1P0000 at [redacted]w[redacted]d presented for IOL for uncontrolled pre-existing diabetes and gHTN.  S: Doing well without complaints. Sleeping upon entry to room.  O:  BP 129/76   Pulse 65   Temp 99 F (37.2 C) (Oral)   Resp 18   Ht 5\' 2"  (1.575 m)   Wt 94.9 kg   LMP 10/16/2019 (Exact Date)   SpO2 98%   BMI 38.28 kg/m  EFM: baseline HR 150 /mod variability/accels present, no decels  Toco: difficult to trace  Dilation: 5 Effacement (%): 50 Station: -2 Presentation: Vertex Exam by:: Dr Sylvester Harder   A&P: 25 y.o. G1P0000 [redacted]w[redacted]d  #IOL: S/p cyto and FB. Pit started at 1350 07/03/20, continue to titrate. Discussed AROM risks/benefits but given G1 and fetal station elected to defer to next exam. Patient's cervical exam unchanged. Discussed that an epidural may be helpful for her, will have anesthesia come discuss risks/benefits. #Pain: analgesia prn. Requests epidural prior to delivery  #FWB: category 1 strip #GBS positive , PCN, adequate prophylaxis #COVID +: airborne precautions #T2DM: stable, continue q4 monitoring during latent labor, and increase to q2 during active labor. Re-start metformin postpartum. EFW 89%ile with AC >99%ile, SD precautions at delivery. #GHTN: asymptomatic. No severe range BP. Repeat preE labs unremarkable, except transaminitis Continue to monitor. #HSV: on suppression, no lesions noted on external exam. #Transaminitis: ALT elevated on admission 56-->82 on recheck. AST initially 37-->82 on recheck. Likely in the setting of COVID.  Arrie Senate, MD [facility] 1:42 AM

## 2020-07-04 NOTE — Progress Notes (Signed)
Labor Progress Note Andrea Black is a 25 y.o. G1P0000 at [redacted]w[redacted]d presented for IOL for uncontrolled pre-existing diabetes and gHTN.  S: Doing well without complaints. Sleeping upon entry to room.  O:  BP 112/62   Pulse 62   Temp 99 F (37.2 C) (Oral)   Resp 18   Ht 5\' 2"  (1.575 m)   Wt 94.9 kg   LMP 10/16/2019 (Exact Date)   SpO2 100%   BMI 38.28 kg/m  EFM: baseline HR 140 /mod variability/accels present, no decels  Toco: q1-3 min  Dilation: 5 Effacement (%): 50 Station: -2 Presentation: Vertex Exam by:: Dr Sylvester Harder   A&P: 25 y.o. G1P0000 [redacted]w[redacted]d  #IOL: S/p cyto and FB. Pit started at 1350 07/03/20, continue to titrate. Discussed AROM risks/benefits but given G1 and fetal station elected to defer to next exam.  #Pain: epidural #FWB: category 1 #GBS positive , PCN, adequate prophylaxis #COVID +: airborne precautions #T2DM: stable, continue q4 monitoring during latent labor, and increase to q2 during active labor. Re-start metformin postpartum. EFW 89%ile with AC >99%ile, SD precautions at delivery. #GHTN: asymptomatic. No severe range BP. Repeat preE labs unremarkable, except transaminitis Continue to monitor. #HSV: on suppression, no lesions noted on external exam. #Transaminitis: ALT elevated on admission 56-->82 on recheck. AST initially 37-->82 on recheck. Likely in the setting of COVID.  Arrie Senate, MD [facility] 6:28 AM

## 2020-07-04 NOTE — Progress Notes (Signed)
Labor Progress Note Andrea Black is a 25 y.o. G1P0000 at [redacted]w[redacted]d presented for IOL for uncontrolled pre-existing diabetes and gHTN.  S: Pt reporting fatigue but motivated to continue towards the goal of a vaginal delivery. Supportive partner at bedside.  O:  BP 125/65   Pulse (!) 59   Temp 98 F (36.7 C) (Oral)   Resp 18   Ht 5\' 2"  (1.575 m)   Wt 94.9 kg   LMP 10/16/2019 (Exact Date)   SpO2 100%   BMI 38.28 kg/m  EFM: baseline HR 140 /mod variability/accels present, no decels  Toco: minimal uterine activity s/p discontinuation of pitocin  Dilation: 5.5 Effacement (%): 80 Station: Plus 1 (cappit) Presentation: Vertex Exam by:: Andrea Kirchman, MD   A&P: 25 y.o. G1P0000 [redacted]w[redacted]d  #IOL: S/p cyto and FB. Pit started at 1350 on 07/03/20. AROM for clear fluid at 1345. Given minimal cervical change since 2130 on 07/03/20 despite AROM, discontinued pitocin at 1800 with plans for a 4 hour break. Pending fetal tolerance will attempt to restart high-dose pitocin in 4 hours. Discussed the plan with pt and partner in-depth. She is aware that if no cervical change despite pit break and/or if fetal intolerance, a Cesarean may be indicated. She understands her partner may not be present in the OR if this is the outcome. Pt appears reassured s/p discussing the potential scenarios. #Pain: epidural in place #FWB: category 1 #GBS positive , PCN, s/p adequate prophylaxis #COVID +: airborne precautions #T2DM: stable, continue q4 monitoring during latent labor, and increase to q2 during active labor. Plan to re-start metformin postpartum. EFW 89%ile with AC >99%ile, SD precautions at delivery. #GHTN: Asymptomatic. No persistent severe range Bps. Repeat preE labs unremarkable, except for elevated LFTs. Continue to monitor. #HSV: on suppression, no lesions noted on external exam. #Transaminitis: ALT elevated on admission 56-->82 on recheck. AST initially 37-->82 on recheck. Likely in the setting of COVID. Repeat CMP  this PM c/w prior.  Andrea Black, Andrea Black for Andrea Black 8:33 PM

## 2020-07-04 NOTE — Progress Notes (Incomplete)
Labor Progress Note Hortencia Martire is a 25 y.o. G1P0000 at [redacted]w[redacted]d presented for IOL for uncontrolled pre-existing diabetes and gHTN. S: Patient is nauseous and vomiting when we came in to check on her. Denies being in pain after receiving her epidural.    O:  BP (!) 141/93 Comment: on the phone talking,moving arm  Pulse 68   Temp 97.7 F (36.5 C) (Oral)   Resp 18   Ht 5\' 2"  (1.575 m)   Wt 94.9 kg   LMP 10/16/2019 (Exact Date)   SpO2 100%   BMI 38.28 kg/m  EFM: ***/***/***  CVE: Dilation: 5 Effacement (%): 70 Station: -2 Presentation: Vertex Exam by:: [place], MD   A&P: 25 y.o. G1P0000 [redacted]w[redacted]d presented for IOL for uncontrolled pre-existing diabetes and gHTN. #Labor: S/p cyto and FB. Pit started at 1350 07/03/20, continue to titrate. AROM + IUPC @ 1345 #Pain: epidural #FWB: Category 1 #GBS positive , PCN, adequate prophylaxis  #COVID +: airborne precautions #T2DM: stable, continue q4 monitoring during latent labor, and increase to q2 during active labor. Re-start metformin postpartum. EFW 89%ile with AC >99%ile, SD precautions at delivery. #GHTN: asymptomatic. No severe range BP. Repeat preE labs unremarkable, except transaminitis Continue to monitor. #HSV: on suppression, no lesions noted on external exam. #Transaminitis: ALT elevated on admission 56-->82 on recheck. AST initially 37-->82 on recheck. Likely in the setting of COVID.  [place] for [place] 2:11 PM

## 2020-07-05 ENCOUNTER — Encounter (HOSPITAL_COMMUNITY)
Admission: AD | Disposition: A | Discharge status: Home / Self Care | Payer: Self-pay | Source: Home / Self Care | Attending: Obstetrics and Gynecology

## 2020-07-05 ENCOUNTER — Inpatient Hospital Stay (HOSPITAL_COMMUNITY): Payer: Medicaid Other

## 2020-07-05 ENCOUNTER — Encounter (HOSPITAL_COMMUNITY): Payer: Self-pay | Admitting: Family Medicine

## 2020-07-05 DIAGNOSIS — O98513 Other viral diseases complicating pregnancy, third trimester: Secondary | ICD-10-CM | POA: Diagnosis present

## 2020-07-05 DIAGNOSIS — O134 Gestational [pregnancy-induced] hypertension without significant proteinuria, complicating childbirth: Secondary | ICD-10-CM

## 2020-07-05 DIAGNOSIS — O2412 Pre-existing diabetes mellitus, type 2, in childbirth: Secondary | ICD-10-CM

## 2020-07-05 DIAGNOSIS — O9853 Other viral diseases complicating the puerperium: Secondary | ICD-10-CM

## 2020-07-05 DIAGNOSIS — U071 COVID-19: Secondary | ICD-10-CM | POA: Diagnosis present

## 2020-07-05 LAB — COMPREHENSIVE METABOLIC PANEL
ALT: 54 U/L — ABNORMAL HIGH (ref 0–44)
AST: 44 U/L — ABNORMAL HIGH (ref 15–41)
Albumin: 1.8 g/dL — ABNORMAL LOW (ref 3.5–5.0)
Alkaline Phosphatase: 75 U/L (ref 38–126)
Anion gap: 11 (ref 5–15)
BUN: 6 mg/dL (ref 6–20)
CO2: 16 mmol/L — ABNORMAL LOW (ref 22–32)
Calcium: 7.6 mg/dL — ABNORMAL LOW (ref 8.9–10.3)
Chloride: 109 mmol/L (ref 98–111)
Creatinine, Ser: 1.04 mg/dL — ABNORMAL HIGH (ref 0.44–1.00)
GFR, Estimated: >60 mL/min (ref >60)
Glucose, Bld: 174 mg/dL — ABNORMAL HIGH (ref 70–99)
Potassium: 3.4 mmol/L — ABNORMAL LOW (ref 3.5–5.1)
Sodium: 136 mmol/L (ref 135–145)
Total Bilirubin: 1.4 mg/dL — ABNORMAL HIGH (ref 0.3–1.2)
Total Protein: 4 g/dL — ABNORMAL LOW (ref 6.5–8.1)

## 2020-07-05 LAB — CBC
HCT: 28.8 % — ABNORMAL LOW (ref 36.0–46.0)
HCT: 18.1 % — ABNORMAL LOW (ref 36.0–46.0)
Hemoglobin: 9.9 g/dL — ABNORMAL LOW (ref 12.0–15.0)
Hemoglobin: 6.3 g/dL — CL (ref 12.0–15.0)
MCH: 29.7 pg (ref 26.0–34.0)
MCH: 30.4 pg (ref 26.0–34.0)
MCHC: 34.4 g/dL (ref 30.0–36.0)
MCHC: 34.8 g/dL (ref 30.0–36.0)
MCV: 86.5 fL (ref 80.0–100.0)
MCV: 87.4 fL (ref 80.0–100.0)
Platelets: 218 10*3/uL (ref 150–400)
Platelets: 236 10*3/uL (ref 150–400)
RBC: 3.33 MIL/uL — ABNORMAL LOW (ref 3.87–5.11)
RBC: 2.07 MIL/uL — ABNORMAL LOW (ref 3.87–5.11)
RDW: 13.2 % (ref 11.5–15.5)
RDW: 13.1 % (ref 11.5–15.5)
WBC: 10.6 10*3/uL — ABNORMAL HIGH (ref 4.0–10.5)
WBC: 14 10*3/uL — ABNORMAL HIGH (ref 4.0–10.5)
nRBC: 0 % (ref 0.0–0.2)
nRBC: 0 % (ref 0.0–0.2)

## 2020-07-05 LAB — SAVE SMEAR(SSMR), FOR PROVIDER SLIDE REVIEW

## 2020-07-05 LAB — FIBRINOGEN: Fibrinogen: 327 mg/dL (ref 210–475)

## 2020-07-05 LAB — D-DIMER, QUANTITATIVE: D-Dimer, Quant: >20 ug/mL-FEU — ABNORMAL HIGH (ref 0.00–0.50)

## 2020-07-05 LAB — PREPARE RBC (CROSSMATCH)

## 2020-07-05 LAB — GLUCOSE, CAPILLARY
Glucose-Capillary: 77 mg/dL (ref 70–99)
Glucose-Capillary: 91 mg/dL (ref 70–99)
Glucose-Capillary: 80 mg/dL (ref 70–99)
Glucose-Capillary: 105 mg/dL — ABNORMAL HIGH (ref 70–99)
Glucose-Capillary: 114 mg/dL — ABNORMAL HIGH (ref 70–99)
Glucose-Capillary: 147 mg/dL — ABNORMAL HIGH (ref 70–99)

## 2020-07-05 LAB — APTT: aPTT: 33 seconds (ref 24–36)

## 2020-07-05 LAB — PROTIME-INR
INR: 1.4 — ABNORMAL HIGH (ref 0.8–1.2)
Prothrombin Time: 16.6 seconds — ABNORMAL HIGH (ref 11.4–15.2)

## 2020-07-05 SURGERY — Surgical Case
Anesthesia: Epidural

## 2020-07-05 MED ORDER — OXYCODONE HCL 5 MG/5ML PO SOLN: 5.0000 mg | Freq: Once | ORAL | Status: DC | PRN

## 2020-07-05 MED ORDER — DEXAMETHASONE SODIUM PHOSPHATE 4 MG/ML IJ SOLN
INTRAMUSCULAR | Status: DC | PRN
  Administered 2020-07-05: 4 mg via INTRAVENOUS

## 2020-07-05 MED ORDER — OXYCODONE HCL 5 MG PO TABS: 5.0000 mg | ORAL_TABLET | Freq: Once | ORAL | Status: DC | PRN

## 2020-07-05 MED ORDER — DEXAMETHASONE SODIUM PHOSPHATE 4 MG/ML IJ SOLN
INTRAMUSCULAR | Status: AC
  Filled 2020-07-05: qty 1

## 2020-07-05 MED ORDER — OXYCODONE HCL 5 MG PO TABS
5.0000 mg | ORAL_TABLET | ORAL | Status: DC | PRN
Start: 2020-07-05 — End: 2020-07-07
  Administered 2020-07-06: 5 mg via ORAL
  Filled 2020-07-05: qty 2
  Filled 2020-07-05: qty 1

## 2020-07-05 MED ORDER — IOHEXOL 350 MG/ML SOLN
75.0000 mL | Freq: Once | INTRAVENOUS | Status: AC | PRN
  Administered 2020-07-05: 75 mL via INTRAVENOUS

## 2020-07-05 MED ORDER — DIPHENHYDRAMINE HCL 25 MG PO CAPS: 25.0000 mg | ORAL_CAPSULE | Freq: Four times a day (QID) | ORAL | Status: DC | PRN

## 2020-07-05 MED ORDER — TETANUS-DIPHTH-ACELL PERTUSSIS 5-2.5-18.5 LF-MCG/0.5 IM SUSY: 0.5000 mL | PREFILLED_SYRINGE | Freq: Once | INTRAMUSCULAR | Status: DC

## 2020-07-05 MED ORDER — METFORMIN HCL 500 MG PO TABS
500.0000 mg | ORAL_TABLET | Freq: Every day | ORAL | Status: DC
  Administered 2020-07-05 – 2020-07-06 (×2): 500 mg via ORAL
  Filled 2020-07-05 (×2): qty 1

## 2020-07-05 MED ORDER — MEASLES, MUMPS & RUBELLA VAC IJ SOLR: 0.5000 mL | Freq: Once | INTRAMUSCULAR | Status: DC

## 2020-07-05 MED ORDER — METOCLOPRAMIDE HCL 5 MG/ML IJ SOLN
INTRAMUSCULAR | Status: AC
  Filled 2020-07-05: qty 2

## 2020-07-05 MED ORDER — HYDROMORPHONE HCL 1 MG/ML IJ SOLN: 0.2500 mg | INTRAMUSCULAR | Status: DC | PRN

## 2020-07-05 MED ORDER — FENTANYL CITRATE (PF) 100 MCG/2ML IJ SOLN
INTRAMUSCULAR | Status: AC
  Filled 2020-07-05: qty 4

## 2020-07-05 MED ORDER — NALBUPHINE HCL 10 MG/ML IJ SOLN: 5.0000 mg | Freq: Once | INTRAMUSCULAR | Status: DC | PRN

## 2020-07-05 MED ORDER — OXYTOCIN-SODIUM CHLORIDE 30-0.9 UT/500ML-% IV SOLN
INTRAVENOUS | Status: AC
  Filled 2020-07-05: qty 500

## 2020-07-05 MED ORDER — NALOXONE HCL 4 MG/10ML IJ SOLN
1.0000 ug/kg/h | INTRAMUSCULAR | Status: DC | PRN
Start: 2020-07-05 — End: 2020-07-07
  Filled 2020-07-05: qty 5

## 2020-07-05 MED ORDER — WITCH HAZEL-GLYCERIN EX PADS: 1.0000 "application " | MEDICATED_PAD | CUTANEOUS | Status: DC | PRN

## 2020-07-05 MED ORDER — MENTHOL 3 MG MT LOZG: 1.0000 | LOZENGE | OROMUCOSAL | Status: DC | PRN

## 2020-07-05 MED ORDER — SODIUM CHLORIDE 0.9% IV SOLUTION: Freq: Once | INTRAVENOUS | Status: DC

## 2020-07-05 MED ORDER — CEFAZOLIN SODIUM-DEXTROSE 2-4 GM/100ML-% IV SOLN
2.0000 g | INTRAVENOUS | Status: AC
  Administered 2020-07-05: 2 g via INTRAVENOUS

## 2020-07-05 MED ORDER — ONDANSETRON HCL 4 MG/2ML IJ SOLN
INTRAMUSCULAR | Status: DC | PRN
  Administered 2020-07-05: 4 mg via INTRAVENOUS

## 2020-07-05 MED ORDER — DIBUCAINE (PERIANAL) 1 % EX OINT: 1.0000 "application " | TOPICAL_OINTMENT | CUTANEOUS | Status: DC | PRN

## 2020-07-05 MED ORDER — SCOPOLAMINE 1 MG/3DAYS TD PT72: 1.0000 | MEDICATED_PATCH | Freq: Once | TRANSDERMAL | Status: DC

## 2020-07-05 MED ORDER — COCONUT OIL OIL: 1.0000 "application " | TOPICAL_OIL | Status: DC | PRN

## 2020-07-05 MED ORDER — SODIUM CHLORIDE 0.9% FLUSH: 3.0000 mL | INTRAVENOUS | Status: DC | PRN

## 2020-07-05 MED ORDER — DIPHENHYDRAMINE HCL 50 MG/ML IJ SOLN: 12.5000 mg | INTRAMUSCULAR | Status: DC | PRN

## 2020-07-05 MED ORDER — IBUPROFEN 800 MG PO TABS: 800.0000 mg | ORAL_TABLET | Freq: Four times a day (QID) | ORAL | Status: DC

## 2020-07-05 MED ORDER — AMLODIPINE BESYLATE 5 MG PO TABS: 5.0000 mg | ORAL_TABLET | Freq: Every day | ORAL | Status: DC

## 2020-07-05 MED ORDER — LACTATED RINGERS IV SOLN: INTRAVENOUS | Status: DC

## 2020-07-05 MED ORDER — MORPHINE SULFATE (PF) 0.5 MG/ML IJ SOLN
INTRAMUSCULAR | Status: AC
  Filled 2020-07-05: qty 10

## 2020-07-05 MED ORDER — PRENATAL MULTIVITAMIN CH
1.0000 | ORAL_TABLET | Freq: Every day | ORAL | Status: DC
  Administered 2020-07-06: 1 via ORAL
  Filled 2020-07-05: qty 1

## 2020-07-05 MED ORDER — MISOPROSTOL 200 MCG PO TABS
ORAL_TABLET | ORAL | Status: AC
  Filled 2020-07-05: qty 5

## 2020-07-05 MED ORDER — LACTATED RINGERS IV SOLN: INTRAVENOUS | Status: DC | PRN

## 2020-07-05 MED ORDER — OXYTOCIN-SODIUM CHLORIDE 30-0.9 UT/500ML-% IV SOLN
INTRAVENOUS | Status: DC | PRN
  Administered 2020-07-05: 300 mL via INTRAVENOUS

## 2020-07-05 MED ORDER — SIMETHICONE 80 MG PO CHEW
80.0000 mg | CHEWABLE_TABLET | Freq: Three times a day (TID) | ORAL | Status: DC
  Administered 2020-07-06 – 2020-07-07 (×3): 80 mg via ORAL
  Filled 2020-07-05 (×3): qty 1

## 2020-07-05 MED ORDER — FENTANYL CITRATE (PF) 100 MCG/2ML IJ SOLN
INTRAMUSCULAR | Status: DC | PRN
  Administered 2020-07-05: 100 ug via EPIDURAL

## 2020-07-05 MED ORDER — METHYLERGONOVINE MALEATE 0.2 MG/ML IJ SOLN
INTRAMUSCULAR | Status: AC
  Filled 2020-07-05: qty 3

## 2020-07-05 MED ORDER — OXYTOCIN-SODIUM CHLORIDE 30-0.9 UT/500ML-% IV SOLN: 2.5000 [IU]/h | INTRAVENOUS | Status: AC

## 2020-07-05 MED ORDER — KETOROLAC TROMETHAMINE 30 MG/ML IJ SOLN
INTRAMUSCULAR | Status: AC
  Filled 2020-07-05: qty 1

## 2020-07-05 MED ORDER — LIDOCAINE-EPINEPHRINE (PF) 2 %-1:200000 IJ SOLN
INTRAMUSCULAR | Status: DC | PRN
  Administered 2020-07-05: 8 mL via EPIDURAL
  Administered 2020-07-05: 2 mL via EPIDURAL

## 2020-07-05 MED ORDER — DEXMEDETOMIDINE (PRECEDEX) IN NS 20 MCG/5ML (4 MCG/ML) IV SYRINGE
PREFILLED_SYRINGE | INTRAVENOUS | Status: DC | PRN
  Administered 2020-07-05 (×2): 4 ug via INTRAVENOUS

## 2020-07-05 MED ORDER — FENTANYL CITRATE (PF) 100 MCG/2ML IJ SOLN
INTRAMUSCULAR | Status: DC | PRN
  Administered 2020-07-05: 50 ug via INTRAVENOUS

## 2020-07-05 MED ORDER — SODIUM CHLORIDE 0.9 % IV SOLN
INTRAVENOUS | Status: AC
  Filled 2020-07-05: qty 500

## 2020-07-05 MED ORDER — ONDANSETRON HCL 4 MG/2ML IJ SOLN
INTRAMUSCULAR | Status: AC
  Filled 2020-07-05: qty 2

## 2020-07-05 MED ORDER — ENOXAPARIN SODIUM 40 MG/0.4ML ~~LOC~~ SOLN
40.0000 mg | SUBCUTANEOUS | Status: DC
  Administered 2020-07-06 – 2020-07-07 (×2): 40 mg via SUBCUTANEOUS
  Filled 2020-07-05 (×2): qty 0.4

## 2020-07-05 MED ORDER — FENTANYL CITRATE (PF) 100 MCG/2ML IJ SOLN
INTRAMUSCULAR | Status: AC
  Filled 2020-07-05: qty 2

## 2020-07-05 MED ORDER — NALOXONE HCL 0.4 MG/ML IJ SOLN: 0.4000 mg | INTRAMUSCULAR | Status: DC | PRN

## 2020-07-05 MED ORDER — DEXTROSE IN LACTATED RINGERS 5 % IV SOLN: INTRAVENOUS | Status: DC

## 2020-07-05 MED ORDER — ACETAMINOPHEN 500 MG PO TABS
1000.0000 mg | ORAL_TABLET | Freq: Three times a day (TID) | ORAL | Status: DC
  Administered 2020-07-06 – 2020-07-07 (×5): 1000 mg via ORAL
  Filled 2020-07-05 (×5): qty 2

## 2020-07-05 MED ORDER — TRANEXAMIC ACID-NACL 1000-0.7 MG/100ML-% IV SOLN
INTRAVENOUS | Status: AC
  Filled 2020-07-05: qty 100

## 2020-07-05 MED ORDER — NALBUPHINE HCL 10 MG/ML IJ SOLN: 5.0000 mg | INTRAMUSCULAR | Status: DC | PRN

## 2020-07-05 MED ORDER — SODIUM CHLORIDE 0.9 % IV SOLN
500.0000 mg | Freq: Once | INTRAVENOUS | Status: AC
  Administered 2020-07-05: 500 mg via INTRAVENOUS

## 2020-07-05 MED ORDER — PROMETHAZINE HCL 25 MG/ML IJ SOLN
12.5000 mg | Freq: Four times a day (QID) | INTRAMUSCULAR | Status: DC | PRN
  Administered 2020-07-05: 12.5 mg via INTRAVENOUS
  Filled 2020-07-05: qty 1

## 2020-07-05 MED ORDER — ONDANSETRON HCL 4 MG/2ML IJ SOLN: 4.0000 mg | Freq: Three times a day (TID) | INTRAMUSCULAR | Status: DC | PRN

## 2020-07-05 MED ORDER — KETOROLAC TROMETHAMINE 30 MG/ML IJ SOLN
30.0000 mg | Freq: Once | INTRAMUSCULAR | Status: AC | PRN
  Administered 2020-07-05: 30 mg via INTRAVENOUS

## 2020-07-05 MED ORDER — KETOROLAC TROMETHAMINE 30 MG/ML IJ SOLN
30.0000 mg | Freq: Four times a day (QID) | INTRAMUSCULAR | Status: DC
  Administered 2020-07-05 – 2020-07-06 (×2): 30 mg via INTRAVENOUS
  Filled 2020-07-05 (×2): qty 1

## 2020-07-05 MED ORDER — SENNOSIDES-DOCUSATE SODIUM 8.6-50 MG PO TABS
2.0000 | ORAL_TABLET | ORAL | Status: DC
  Administered 2020-07-06: 2 via ORAL
  Filled 2020-07-05: qty 2

## 2020-07-05 MED ORDER — FERROUS SULFATE 325 (65 FE) MG PO TABS
325.0000 mg | ORAL_TABLET | ORAL | Status: DC
  Administered 2020-07-07: 325 mg via ORAL
  Filled 2020-07-05: qty 1

## 2020-07-05 MED ORDER — ONDANSETRON HCL 4 MG/2ML IJ SOLN: 4.0000 mg | Freq: Once | INTRAMUSCULAR | Status: DC | PRN

## 2020-07-05 MED ORDER — DIPHENHYDRAMINE HCL 25 MG PO CAPS: 25.0000 mg | ORAL_CAPSULE | ORAL | Status: DC | PRN

## 2020-07-05 MED ORDER — METOCLOPRAMIDE HCL 5 MG/ML IJ SOLN
INTRAMUSCULAR | Status: DC | PRN
  Administered 2020-07-05 (×2): 5 mg via INTRAVENOUS

## 2020-07-05 MED ORDER — CARBOPROST TROMETHAMINE 250 MCG/ML IM SOLN
INTRAMUSCULAR | Status: AC
  Filled 2020-07-05: qty 1

## 2020-07-05 MED ORDER — SIMETHICONE 80 MG PO CHEW: 80.0000 mg | CHEWABLE_TABLET | ORAL | Status: DC | PRN

## 2020-07-05 MED ORDER — SOD CITRATE-CITRIC ACID 500-334 MG/5ML PO SOLN
30.0000 mL | ORAL | Status: AC
  Administered 2020-07-05: 30 mL via ORAL
  Filled 2020-07-05: qty 15

## 2020-07-05 SURGICAL SUPPLY — 34 items
APL SKNCLS STERI-STRIP NONHPOA (GAUZE/BANDAGES/DRESSINGS) ×1
BENZOIN TINCTURE PRP APPL 2/3 (GAUZE/BANDAGES/DRESSINGS) ×2 IMPLANT
CANISTER SUCT 3000ML PPV (MISCELLANEOUS) ×2 IMPLANT
CHLORAPREP W/TINT 26ML (MISCELLANEOUS) ×2 IMPLANT
DRSG OPSITE POSTOP 4X10 (GAUZE/BANDAGES/DRESSINGS) ×2 IMPLANT
ELECT REM PT RETURN 9FT ADLT (ELECTROSURGICAL) ×2
ELECTRODE REM PT RTRN 9FT ADLT (ELECTROSURGICAL) ×1 IMPLANT
EXTRACTOR VACUUM KIWI (MISCELLANEOUS) ×2 IMPLANT
GLOVE BIOGEL PI IND STRL 7.0 (GLOVE) ×2 IMPLANT
GLOVE BIOGEL PI IND STRL 7.5 (GLOVE) ×1 IMPLANT
GLOVE BIOGEL PI INDICATOR 7.0 (GLOVE) ×2
GLOVE BIOGEL PI INDICATOR 7.5 (GLOVE) ×1
GLOVE SKINSENSE NS SZ7.0 (GLOVE) ×1
GLOVE SKINSENSE STRL SZ7.0 (GLOVE) ×1 IMPLANT
GOWN STRL REUS W/ TWL LRG LVL3 (GOWN DISPOSABLE) ×2 IMPLANT
GOWN STRL REUS W/ TWL XL LVL3 (GOWN DISPOSABLE) ×1 IMPLANT
GOWN STRL REUS W/TWL LRG LVL3 (GOWN DISPOSABLE) ×4
GOWN STRL REUS W/TWL XL LVL3 (GOWN DISPOSABLE) ×2
NS IRRIG 1000ML POUR BTL (IV SOLUTION) ×2 IMPLANT
PACK C SECTION WH (CUSTOM PROCEDURE TRAY) ×2 IMPLANT
PAD ABD 7.5X8 STRL (GAUZE/BANDAGES/DRESSINGS) ×2 IMPLANT
PAD OB MATERNITY 4.3X12.25 (PERSONAL CARE ITEMS) ×2 IMPLANT
PAD PREP 24X48 CUFFED NSTRL (MISCELLANEOUS) ×2 IMPLANT
PENCIL SMOKE EVAC W/HOLSTER (ELECTROSURGICAL) ×2 IMPLANT
STRIP CLOSURE SKIN 1/2X4 (GAUZE/BANDAGES/DRESSINGS) ×2 IMPLANT
SUT MNCRL 0 VIOLET CTX 36 (SUTURE) ×2 IMPLANT
SUT MON AB 4-0 PS1 27 (SUTURE) ×2 IMPLANT
SUT MONOCRYL 0 CTX 36 (SUTURE) ×4
SUT PLAIN 2 0 XLH (SUTURE) ×2 IMPLANT
SUT VIC AB 0 CT1 36 (SUTURE) ×4 IMPLANT
SUT VIC AB 3-0 CT1 27 (SUTURE) ×2
SUT VIC AB 3-0 CT1 TAPERPNT 27 (SUTURE) ×1 IMPLANT
TOWEL OR 17X24 6PK STRL BLUE (TOWEL DISPOSABLE) ×4 IMPLANT
WATER STERILE IRR 1000ML POUR (IV SOLUTION) ×2 IMPLANT

## 2020-07-05 NOTE — Anesthesia Postprocedure Evaluation (Signed)
Anesthesia Post Note  Patient: Nurse, learning disability  Procedure(s) Performed: CESAREAN SECTION (N/A )     Patient location during evaluation: Mother Baby Anesthesia Type: Epidural Level of consciousness: oriented and awake and alert Pain management: pain level controlled Vital Signs Assessment: post-procedure vital signs reviewed and stable Respiratory status: spontaneous breathing and respiratory function stable Cardiovascular status: blood pressure returned to baseline and stable Postop Assessment: no headache, no backache, no apparent nausea or vomiting and able to ambulate Anesthetic complications: no   No complications documented.  Last Vitals:  Vitals:   07/05/20 1246 07/05/20 1407  BP: 131/85 136/90  Pulse: 98 83  Resp:  (!) 21  Temp:  37.2 C  SpO2:      Last Pain:  Vitals:   07/05/20 1407  TempSrc: Oral  PainSc:    Pain Goal:                   Barnet Glasgow

## 2020-07-05 NOTE — Progress Notes (Signed)
Went to do patient's one hour reassessment and patient noticeably lethargic, clammy, and only responding to sternal rubbing. BPs 60s/30s, MD notified and on the way. Code Hemorrhage called just to get anesthesia and the attending to come to her room.  Started LR bolus at 999 cc/hr. Total bolus given was 1100 cc.  Urine outpt stable at 100 cc in one hour.  Patient's BP improving after 3 doses of 80 mcg of neopressin given by CRNA.  Fundus firm but 3-4/U, dressing saturated and peeling off; removed and replaced.  Labs ordered and drawn. Hgb 6.3 notified Dr. Berniece Andreas at [phone] and new orders given.  Will continue to monitor closely.

## 2020-07-05 NOTE — Op Note (Addendum)
Operative Note   SURGERY DATE: 07/05/2020  PRE-OP DIAGNOSIS:  *Pregnancy at [redacted]w[redacted]d * Arrest of dilation at 5-6cm (24 hours of pitocin after AROM) *Failed induction of labor for gestational HTN * DM2  POST-OP DIAGNOSIS: same   PROCEDURE: primary low transverse cesarean section via pfannenstiel skin incision with double layer uterine closure   SURGEON: Surgeon(s) and Role:    * Aletha Halim, MD - Primary    * Marsala, Placido Sou, MD - Assisting  ANESTHESIA: epidural  ESTIMATED BLOOD LOSS: 560  DRAINS: 400 mL UOP via indwelling foley  TOTAL IV FLUIDS: 2000 mL crystalloid  VTE PROPHYLAXIS: SCDs to bilateral lower extremities  ANTIBIOTICS: 2g ancef and 500 mg azithromycin, within 1 hour of skin incision  SPECIMENS: placenta  COMPLICATIONS: none  FINDINGS: No intra-abdominal adhesions were noted. Grossly normal uterus, tubes and ovaries. clear amniotic fluid, cephalic female infant, moderate caput, OP presentation, weight 3201gm, APGARs 8/9, intact placenta.  PROCEDURE IN DETAIL: The patient was taken to the operating room where anesthesia was administered and normal fetal heart tones were confirmed. She was then prepped and draped in the normal fashion in the dorsal supine position with a leftward tilt.  After a time out was performed, a pfannensteil skin incision was made with the scalpel and carried through to the underlying layer of fascia. The fascia was then incised at the midline and this incision was extended laterally with the mayo scissors. Attention was turned to the superior aspect of the fascial incision which was grasped with the kocher clamps x 2, tented up and the rectus muscles were dissected off with the bovie. In a similar fashion the inferior aspect of the fascial incision was grasped with the kocher clamps, tented up and the rectus muscles dissected off with the mayo scissors. The rectus muscles were then separated in the midline and the peritoneum was entered bluntly.  The alexis retractor was inserted. The bladder blade was inserted.  A low transverse hysterotomy was made with the scalpel until the endometrial cavity was breached and the amniotic sac ruptured with the Allis clamp, yielding clear amniotic fluid. This incision was extended bluntly and the infant's head, shoulders and body were delivered atraumatically.The cord was clamped x 2 and cut, and the infant was handed to the awaiting pediatricians, after delayed cord clamping was done.  The placenta was then gradually expressed from the uterus and then the uterus was exteriorized and cleared of all clots and debris. The hysterotomy was repaired with a running suture of 1-0 vicryl. A second imbricating layer of 1-0 vicryl suture was then placed.   The uterus and adnexa were then returned to the abdomen, and the hysterotomy and all operative sites were reinspected and excellent hemostasis was noted after irrigation and suction of the abdomen with warm saline.  The peritoneum was closed with a running stitch of 3-0 Vicryl. The fascia was reapproximated with 0 Vicryl in a simple running fashion bilaterally. The subcutaneous layer was then reapproximated with interrupted sutures of 2-0 plain gut, and the skin was then closed with 4-0 monocryl, in a subcuticular fashion.  The patient  tolerated the procedure well. Sponge, lap, needle, and instrument counts were correct x 2. The patient was transferred to the recovery room awake, alert and breathing independently in stable condition.   Sharene Skeans, MD [facility] Family Medicine Fellow, [facility] for [place], [place] with above. I was present and scrubbed for the entire procedure.   Eduard Clos  Cassell Smiles MD Attending Center for [employer] [facility])

## 2020-07-05 NOTE — Progress Notes (Signed)
Pt  moving FHR not tracing.  RN in room.  Pt and significant other stressed due to being induced and staying at 5 cm for a long period of time.  RN listening to their concerns Dr Berniece Andreas notified  of concerns

## 2020-07-05 NOTE — Progress Notes (Signed)
Epidural dosed for surgery per Dr Valma Cava

## 2020-07-05 NOTE — Progress Notes (Signed)
RN to transport patient to CT.

## 2020-07-05 NOTE — Discharge Summary (Signed)
Postpartum Discharge Summary     Patient Name: Andrea Black DOB: 05-07-1996 MRN: [id number]  Date of admission: 07/02/2020 Delivery date:07/05/2020  Delivering provider: Aletha Halim  Date of discharge: 07/07/2020  Admitting diagnosis: Elevated BP without diagnosis of hypertension [R03.0] Intrauterine pregnancy: [redacted]w[redacted]d    Secondary diagnosis:  Active Problems:   Obesity affecting pregnancy, antepartum   Polycystic ovarian disease   Supervision of normal first pregnancy   Pre-existing diabetes mellitus affecting pregnancy in third trimester, antepartum   Group B streptococcal bacteriuria   Gestational hypertension   History of herpes genitalis   Transaminitis   COVID-19 affecting pregnancy in third trimester  Additional problems: none    Discharge diagnosis: Term Pregnancy Delivered, Gestational Hypertension and Type 2 DM                                              Post partum procedures:blood transfusion Augmentation: AROM, Pitocin, Cytotec and IP Foley Complications: None  Hospital course: Induction of Labor With Cesarean Section   25y.o. yo G1P1001 at 325w4das admitted to the hospital 07/02/2020 for induction of labor. Patient had a labor course significant for receiving cytotec, fb and pitocin. She made change to 5 cm however remained 5 cm despite 24 hours ruptured and on pitocin. The patient went for cesarean section due to Arrest of Dilation. Delivery details are as follows: Membrane Rupture Time/Date: 1:43 PM ,07/04/2020   Delivery Method:C-Section, Low Transverse  Details of operation can be found in separate operative Note.  Patient had an postpartum course complicated by acute hypotensive event a few hours after her C section. Labs obtained at that time demonstrated hgb <7, episode felt to be related to vasovagal episode, fatigue, acute blood loss. She also underwent CXR and CTPE given hx of Covid, which were unremarkable. She received 2 units pRBC for low hgb with  good response. By POD#1 she had mild range blood pressures (hx of gHTN) and was discharged on norvasc [id number] is ambulating, tolerating a regular diet, passing flatus, and urinating well.  Patient is discharged home in stable condition on 07/07/20.      Newborn Data: Birth date:07/05/2020  Birth time:1:18 PM  Gender:Female  Living status:Living  Apgars:8 ,9  Weight:3201 g                                 Magnesium Sulfate received: No BMZ received: No Rhophylac:N/A MMR:N/A T-DaP:Given prenatally Flu: No Transfusion:Yes  Physical exam  Vitals:   07/06/20 0448 07/06/20 0828 07/06/20 2204 07/07/20 0518  BP: (!) 127/93 131/86 (!) 145/88 122/83  Pulse: (!) 59 64  70  Resp: '18 16 17 16  ' Temp: 98 F (36.7 C) 98.2 F (36.8 C) 99.1 F (37.3 C) 98.9 F (37.2 C)  TempSrc: Oral Oral Oral Oral  SpO2: 100%   100%  Weight:      Height:       General: alert, cooperative and no distress Lochia: appropriate Uterine Fundus: firm Incision: Healing well with no significant drainage DVT Evaluation: No evidence of DVT seen on physical exam. Labs: Lab Results  Component Value Date   WBC 12.3 (H) 07/06/2020   HGB 7.4 (L) 07/06/2020   HCT 20.9 (L) 07/06/2020   MCV 86.0 07/06/2020   PLT 154 07/06/2020  CMP Latest Ref Rng & Units 07/06/2020  Glucose 70 - 99 mg/dL 100(H)  BUN 6 - 20 mg/dL 9  Creatinine 0.44 - 1.00 mg/dL 1.18(H)  Sodium 135 - 145 mmol/L 129(L)  Potassium 3.5 - 5.1 mmol/L 3.6  Chloride 98 - 111 mmol/L 99  CO2 22 - 32 mmol/L 21(L)  Calcium 8.9 - 10.3 mg/dL 8.0(L)  Total Protein 6.5 - 8.1 g/dL 4.4(L)  Total Bilirubin 0.3 - 1.2 mg/dL 0.6  Alkaline Phos 38 - 126 U/L 88  AST 15 - 41 U/L 41  ALT 0 - 44 U/L 43   Edinburgh Score: Edinburgh Postnatal Depression Scale Screening Tool 07/06/2020  I have been able to laugh and see the funny side of things. 0  I have looked forward with enjoyment to things. 0  I have blamed myself unnecessarily when things went wrong. 2  I have  been anxious or worried for no good reason. 2  I have felt scared or panicky for no good reason. 1  Things have been getting on top of me. 1  I have been so unhappy that I have had difficulty sleeping. 0  I have felt sad or miserable. 0  I have been so unhappy that I have been crying. 0  The thought of harming myself has occurred to me. 0  Edinburgh Postnatal Depression Scale Total 6     After visit meds:  Allergies as of 07/07/2020   No Known Allergies     Medication List    STOP taking these medications   Accu-Chek Guide test strip Generic drug: glucose blood   Accu-Chek Guide w/Device Kit   Accu-Chek Softclix Lancets lancets   aspirin EC 81 MG tablet   Blood Pressure Kit Devi   Butalbital-APAP-Caffeine 50-325-40 MG capsule   Comfort Fit Maternity Supp Med Misc   Continuous Blood Gluc Receiver Devi   Continuous Blood Gluc Sensor Misc   insulin glargine 100 UNIT/ML injection Commonly known as: Lantus   insulin lispro 100 UNIT/ML injection Commonly known as: HumaLOG   terconazole 0.4 % vaginal cream Commonly known as: TERAZOL 7   valACYclovir 500 MG tablet Commonly known as: Valtrex     TAKE these medications   acetaminophen 500 MG tablet Commonly known as: TYLENOL Take 2 tablets (1,000 mg total) by mouth every 8 (eight) hours. What changed:   medication strength  how much to take  when to take this   amLODipine 5 MG tablet Commonly known as: NORVASC Take 1 tablet (5 mg total) by mouth daily.   coconut oil Oil Apply 1 application topically as needed.   ferrous sulfate 325 (65 FE) MG tablet Take 1 tablet (325 mg total) by mouth every other day.   metFORMIN 500 MG tablet Commonly known as: GLUCOPHAGE Take 1 tablet (500 mg total) by mouth 2 (two) times daily with a meal. What changed: when to take this   multivitamin-prenatal 27-0.8 MG Tabs tablet Take 1 tablet by mouth daily at 12 noon.   oxyCODONE 5 MG immediate release tablet Commonly  known as: Oxy IR/ROXICODONE Take 1 tablet (5 mg total) by mouth every 4 (four) hours as needed for moderate pain.   polyethylene glycol powder 17 GM/SCOOP powder Commonly known as: GLYCOLAX/MIRALAX Take 255 g by mouth once for 1 dose.        Discharge home in stable condition Infant Feeding: Breast Infant Disposition:home with mother Discharge instruction: per After Visit Summary and Postpartum booklet. Activity: Advance as tolerated. Pelvic rest for  6 weeks.  Diet: routine diet Future Appointments: Future Appointments  Date Time Provider [place]  07/15/2020  4:00 PM [place] None  08/18/2020  8:45 AM [facility] None  08/18/2020  9:00 AM Constant, Peggy, MD [facility] None   Follow up Visit:   Please schedule this patient for a In person postpartum visit in 6 weeks with the following provider: MD. Additional Postpartum F/U:Incision check 1 week and BP check 1 week (Covid Pos diagnosed on 1/6), needs PCP follow up for DM2  High risk pregnancy complicated by: DM2, gHTN  Delivery mode:  C-Section, Low Transverse  Anticipated Birth Control:  Unsure << declines    07/07/2020 Janet Berlin, MD

## 2020-07-05 NOTE — Progress Notes (Signed)
L&D Note Cervix unchanged, more caput. Fetus still category I with q2-41m UCs. C-section for arrest of dilation recommended and pt amenable to this. D/c pitocin. Ancef and azithro ordered. Can proceed when OR is ready  Durene Romans MD Attending Center for [employer] (Faculty Practice) 07/05/2020 Time: 1217pm

## 2020-07-05 NOTE — Lactation Note (Signed)
This note was copied from a baby's chart. Lactation Consultation Note  Patient Name: Andrea Black TJQZE'S Date: 07/05/2020 Reason for consult: Initial assessment;Other (Comment);Primapara;1st time breastfeeding;Maternal endocrine disorder;Early term 3-38.6wks (COVID (+)) Age:24 hours  Visited with mom of 3 hours old ETI female, she's a P1 and didn't get to latch baby in PACU due to her C/S, mom, sticky note said mom has been sick, checked with RN Cyril Mourning prior entering her room and she OK'd LC to visit mom. She has DM2 and first baby's serum glucose was at 51 and WNL. According to FOB she participated in the [facility] program during the pregnancy, she gets mail from them.  FOB doing STS when entering the room, mom was laying down on her side and voiced baby must be hungry and she wanted to attempt to feed. LC was getting ready to teach mom how to hand express, when she asked to call her RN because she felt dizzy. LC called her RN and she came right away, unable to work on latch at this time, she'll need to be follow up later tonight or tomorrow.  Mom is also COVID (+) and her feeding choice on admission was to do both, breast and formula. Even though baby is early term, her birth weight is > 6 lbs, so in case she decides to start feedings with formula, standard 20 calorie formula would be appropriate, she won't need to be put in 22 calorie formula.  Feeding plan:  1. Ask mom to call LC/RN when she feels better and is ready to work on latching 2. Mom needs to be taught hand expression 3. Dad will continue doing STS with baby as much as he desires and baby tolerates it  BF brochure, BF resources and feeding diary were discussed with FOB, mom was having an hemorrhage by the time LC was leaving the room. FOB reported all questions and concersns were answered, he's aware of [facility] OP services and will contact PRN.    Maternal Data Formula Feeding for Exclusion: Yes Reason for exclusion: Mother's  choice to formula and breast feed on admission Has patient been taught Hand Expression?: No Does the patient have breastfeeding experience prior to this delivery?: No  Feeding    LATCH Score                   Interventions Interventions: Breast feeding basics reviewed  Lactation Tools Discussed/Used WIC Program: Yes   Consult Status Consult Status: Follow-up Date: 07/05/20 Follow-up type: In-patient    Marrion Finan Francene Boyers 07/05/2020, 4:45 PM

## 2020-07-05 NOTE — Progress Notes (Signed)
Chaplain responded to Code Hemorrhage. Partner/support person was bedside with pt, who is Covid+, and pt is not available.  Advised Financial controller to advise RN to call if support is needed after emergency passes.     Luana Shu [phone]     07/05/20 1700  Clinical Encounter Type  Visited With Patient not available  Visit Type Critical Care  Referral From Nurse  Consult/Referral To Chaplain  Stress Factors  Patient Stress Factors Health changes

## 2020-07-05 NOTE — Transfer of Care (Signed)
Immediate Anesthesia Transfer of Care Note  Patient: Andrea Black  Procedure(s) Performed: CESAREAN SECTION (N/A )  Patient Location: PACU  Anesthesia Type:Epidural  Level of Consciousness: awake, alert  and oriented  Airway & Oxygen Therapy: Patient Spontanous Breathing  Post-op Assessment: Report given to RN and Post -op Vital signs reviewed and stable  Post vital signs: Reviewed and stable  Last Vitals:  Vitals Value Taken Time  BP 136/90 07/05/20 1407  Temp 37.2 C 07/05/20 1407  Pulse 83 07/05/20 1407  Resp 21 07/05/20 1407  SpO2      Last Pain:  Vitals:   07/05/20 1407  TempSrc: Oral  PainSc:          Complications: No complications documented.

## 2020-07-05 NOTE — Progress Notes (Signed)
Labor Progress Note Andrea Black is a 25 y.o. G1P0000 at [redacted]w[redacted]d presented for IOL for uncontrolled pre-existing diabetes and gHTN.  S: Called to bedside by RN, patient feeling more pressure and urge to have a BM.  O:  BP 117/60   Pulse (!) 54   Temp 99 F (37.2 C) (Oral)   Resp 18   Ht 5\' 2"  (1.575 m)   Wt 94.9 kg   LMP 10/16/2019 (Exact Date)   SpO2 100%   BMI 38.28 kg/m  EFM: baseline 140 bpm /mod variability/accels present, no decels  Toco: q1 min  Dilation: 5 Effacement (%): 90 Station: -1 Presentation: Vertex Exam by:: Jaymes Graff RN   A&P: 25 y.o. G1P0000 [redacted]w[redacted]d  #IOL: S/p cyto and FB. Pit started at 1350 on 07/03/20. AROM for clear fluid at 1345 on 1/8. Given minimal cervical change since 2130 on 07/03/20 despite AROM, discontinued pitocin at 1800 on 1/8. Pitocin restarted 1/8 @2200 , head is more well applied, and effacement increased. Continue to titrate. IUPC in place, but not reading well, has been replaced/flushed multiple times. Will plan to recheck in 4 hours. Given how uncomfortable patient is now, suspect patient is transitioning. #Pain: epidural #FWB: category 1 #GBS positive , PCN, s/p adequate prophylaxis #COVID +: airborne precautions #T2DM: stable, continue q4 monitoring during latent labor, and increase to q2 during active labor. Plan to re-start metformin postpartum. EFW 89%ile with AC >99%ile, SD precautions at delivery. #GHTN: Asymptomatic. No persistent severe range Bps. Repeat preE labs unremarkable, except for elevated LFTs. Continue to monitor. #HSV: on suppression, no lesions noted on external exam. #Transaminitis: ALT elevated on admission 56-->82 on recheck. AST initially 37-->82 on recheck. Likely in the setting of COVID.   Arrie Senate, MD Center for [employer], [place] [facility] 3:13 AM

## 2020-07-05 NOTE — Progress Notes (Signed)
Pt's second unit of pRBC running. Pt sitting up in bed eating sandwich and was feeding baby. Dr. Sylvester Harder given update on pt. CBC in for 0500. Will continue to monitor patient closely.

## 2020-07-05 NOTE — Progress Notes (Signed)
OB Note CTSP for hypotension Patient minimally responsive and BPs SBP 40s. HR 60s-70s and normal O2 sats on RA. Per RN, she got patient up and s/s started.  Firm fundus below the umbilicus, no bleeding at the incision or vaginally. Patient responds to fundal massage and is responsive to questions. UOP with approximately 141mL UOP in the foley since being on the unit. Rpt BPs confirm initial VS. 2nd IV started and labs already sent and drawn. Anesthesia gave several doses of phenylephirine and BPs responded well and patient became less clammy and more responsive with last BP in the 100s/50s. CBG 147  Willl order CXR and get CT PE later but lower on DDx given only s/s being BP. Leading DDx is potentially related to getting up to quickly, combined with anesthesia still being in effect.    Follow up labs and continue to monitor closely  Durene Romans MD Attending Center for [place] (Faculty Practice) 07/05/2020 Time: 1700

## 2020-07-05 NOTE — Progress Notes (Signed)
L&D Note  07/05/2020 - 9:45 AM  25 y.o. G1P0000 [redacted]w[redacted]d. Pregnancy complicated by covid dx during admit screening,  Patient Active Problem List   Diagnosis Date Noted  . Transaminitis 07/04/2020  . Gestational hypertension 07/02/2020  . History of herpes genitalis 07/02/2020  . Headache in pregnancy, antepartum, second trimester 02/12/2020  . Group B streptococcal bacteriuria 01/06/2020  . Pre-existing diabetes mellitus affecting pregnancy in third trimester, antepartum 01/03/2020  . Supervision of normal first pregnancy 11/14/2019  . Exposure to sexually transmitted disease (STD) 02/15/2017  . Polycystic ovarian disease 04/14/2016  . Obesity affecting pregnancy, antepartum 04/01/2016    Ms. Jaquila Smith-Burch is admitted for gHTN IOL   Subjective:  Feeling more pressure. No s/s of mg toxicity Objective:   Vitals:   07/05/20 0831 07/05/20 0901 07/05/20 0919 07/05/20 0933  BP: 127/64 (!) 136/102  (!) 139/92  Pulse: 69 88  75  Resp: 20 16 16 18   Temp:      TempSrc:      SpO2:      Weight:      Height:        Current Vital Signs 24h Vital Sign Ranges  T 98.2 F (36.8 C) Temp  Avg: 98.2 F (36.8 C)  Min: 97.7 F (36.5 C)  Max: 99 F (37.2 C)  BP (!) 139/92 BP  Min: 112/67  Max: 160/92  HR 75 Pulse  Avg: 61.3  Min: 52  Max: 88  RR 18 Resp  Avg: 18  Min: 16  Max: 20  SaO2 100 %   No data recorded       24 Hour I/O Current Shift I/O  Time Ins Outs 01/08 0701 - 01/09 0700 In: -  Out: 3150 [Urine:3150] No intake/output data recorded.   FHR: 150 baseline, +accels and scalp stim (0930), no decel, mod varability Toco: q2-55m Gen: NAD SVE: 5-6/90/mild caput to 0  Labs:  Recent Labs  Lab 07/03/20 1623 07/03/20 2156 07/04/20 1550  WBC 7.6 7.3 9.1  HGB 9.9* 9.9* 10.8*  HCT 27.4* 28.5* 31.6*  PLT 219 219 243   Recent Labs  Lab 07/02/20 1238 07/03/20 2156 07/04/20 1550  NA 136 134* 136  K 3.6 3.9 3.6  CL 104 102 106  CO2 21* 20* 18*  BUN 8 5* 6  CREATININE  0.59 0.61 0.79  CALCIUM 9.1 8.6* 8.7*  PROT 6.4* 6.0* 6.0*  BILITOT 0.9 1.7* 1.8*  ALKPHOS 115 109 116  ALT 56* 82* 85*  AST 37 71* 74*  GLUCOSE 125* 96 81    Medications Current Facility-Administered Medications  Medication Dose Route Frequency Provider Last Rate Last Admin  . acetaminophen (TYLENOL) tablet 650 mg  650 mg Oral Q4H PRN Rasch, Anderson Malta I, NP      . dextrose 5 % in lactated ringers infusion   Intravenous Continuous Janet Berlin, MD 125 mL/hr at 07/05/20 0914 New Bag at 07/05/20 0914  . diphenhydrAMINE (BENADRYL) injection 12.5 mg  12.5 mg Intravenous Q15 min PRN Woodrum, Chelsey L, MD      . ePHEDrine injection 10 mg  10 mg Intravenous PRN Woodrum, Chelsey L, MD      . ePHEDrine injection 10 mg  10 mg Intravenous PRN Woodrum, Chelsey L, MD      . fentaNYL (SUBLIMAZE) injection 100 mcg  100 mcg Intravenous Q1H PRN Christin Fudge, CNM   100 mcg at 07/03/20 1004  . fentaNYL 2 mcg/mL w/ bupivacaine 0.125% in NS 250 mL epidural infusion ([facility])  12 mL/hr Epidural Continuous PRN Willette AlmaWoodrum, Chelsey L, MD 12 mL/hr at 07/04/20 2111 12 mL/hr at 07/04/20 2111  . labetalol (NORMODYNE) injection 20 mg  20 mg Intravenous PRN Sheila OatsGoswick, Anna E, MD       And  . labetalol (NORMODYNE) injection 40 mg  40 mg Intravenous PRN Sheila OatsGoswick, Anna E, MD       And  . labetalol (NORMODYNE) injection 80 mg  80 mg Intravenous PRN Sheila OatsGoswick, Anna E, MD       And  . hydrALAZINE (APRESOLINE) injection 10 mg  10 mg Intravenous PRN Sheila OatsGoswick, Anna E, MD      . lactated ringers infusion 500 mL  500 mL Intravenous Once Woodrum, Chelsey L, MD      . lactated ringers infusion 500-1,000 mL  500-1,000 mL Intravenous PRN Venia Carbonasch, Jennifer I, NP 999 mL/hr at 07/05/20 0554 1,000 mL at 07/05/20 0554  . lactated ringers infusion   Intravenous Continuous Rasch, Victorino DikeJennifer I, NP 125 mL/hr at 07/05/20 0828 New Bag at 07/05/20 0828  . lidocaine (PF) (XYLOCAINE) 1 % injection 30 mL  30 mL Subcutaneous PRN Rasch,  Victorino DikeJennifer I, NP      . misoprostol (CYTOTEC) tablet 50 mcg  50 mcg Oral Q4H PRN Cora Collumaige, Victoria J, DO   50 mcg at 07/03/20 0907  . ondansetron (ZOFRAN) injection 4 mg  4 mg Intravenous Q6H PRN Rasch, Victorino DikeJennifer I, NP   4 mg at 07/05/20 0428  . oxyCODONE-acetaminophen (PERCOCET/ROXICET) 5-325 MG per tablet 1 tablet  1 tablet Oral Q4H PRN Rasch, Victorino DikeJennifer I, NP      . oxyCODONE-acetaminophen (PERCOCET/ROXICET) 5-325 MG per tablet 2 tablet  2 tablet Oral Q4H PRN Rasch, Victorino DikeJennifer I, NP      . oxytocin (PITOCIN) IV BOLUS FROM BAG  333 mL Intravenous Once Rasch, Victorino DikeJennifer I, NP      . oxytocin (PITOCIN) IV infusion 30 units in NS 500 mL - Premix  2.5 Units/hr Intravenous Continuous Rasch, Victorino DikeJennifer I, NP      . oxytocin (PITOCIN) IV infusion 30 units in NS 500 mL - Premix  1-40 milli-units/min Intravenous Titrated Cora Collumaige, Victoria J, DO 16 mL/hr at 07/05/20 0709 16 milli-units/min at 07/05/20 0709  . oxytocin (PITOCIN) IV infusion 30 units in NS 500 mL - Premix  1-40 milli-units/min Intravenous Titrated Donette LarryBhambri, Melanie, CNM      . penicillin G potassium 3 Million Units in dextrose 50mL IVPB  3 Million Units Intravenous Q4H Rasch, Victorino DikeJennifer I, NP 100 mL/hr at 07/05/20 0821 3 Million Units at 07/05/20 [id number]  . PHENYLephrine 40 mcg/ml in normal saline Adult IV Push Syringe (For Blood Pressure Support)  80 mcg Intravenous PRN Woodrum, Chelsey L, MD      . PHENYLephrine 40 mcg/ml in normal saline Adult IV Push Syringe (For Blood Pressure Support)  80 mcg Intravenous PRN Woodrum, Chelsey L, MD      . promethazine (PHENERGAN) injection 12.5 mg  12.5 mg Intravenous Q6H PRN Alric SetonFirestone, Alicia C, MD   12.5 mg at 07/05/20 0514  . sodium citrate-citric acid (ORACIT) solution 30 mL  30 mL Oral Q2H PRN Rasch, Victorino DikeJennifer I, NP      . terbutaline (BRETHINE) injection 0.25 mg  0.25 mg Subcutaneous Once PRN Paige, Victoria J, DO      . terbutaline (BRETHINE) injection 0.25 mg  0.25 mg Subcutaneous Once PRN Donette LarryBhambri, Melanie, CNM      .  zolpidem (AMBIEN) tablet 5 mg  5 mg Oral QHS PRN Jacklyn Shellresenzo-Dishmon, Frances, CNM  Facility-Administered Medications Ordered in Other Encounters  Medication Dose Route Frequency Provider Last Rate Last Admin  . fentaNYL citrate (PF) (SUBLIMAZE) 500 mcg, bupivacaine HCl (PF) (SENSORCAINE-MPF) 41.67 mL in sodium chloride (PF) 0.9 % 250 mL epidural   Epidural Continuous PRN Barnet Glasgow, MD 12 mL/hr at 07/04/20 0225 12 mL/hr at 07/04/20 0225  . lidocaine (PF) (XYLOCAINE) 1 % injection   Epidural Anesthesia Intra-op Barnet Glasgow, MD   5 mL at 07/04/20 0221  Results for RANDYE, TREICHLER (MRN [id number]) as of 07/05/2020 09:49  Ref. Range 07/04/2020 20:56 07/04/2020 21:18 07/05/2020 01:28 07/05/2020 05:29 07/05/2020 08:20  Glucose-Capillary Latest Ref Range: 70 - 99 mg/dL 53 (L) 155 (H) 77 91 80    Assessment & Plan:  Pt stable *IUP: category I with accels *IOL: d/w her reassess at approx 24hs on pt and s/p ROM which would be around noon today. If no cx change, then recommend c-section which she is amenable to *DM2: doing well on no meds *covid: no issues. lfts likely from this.  *gHTN: no severe s/s.  *GBS: s/p adequate tx with PCN *Analgesia: epidural in place  Durene Romans. MD Attending Center for [facility])

## 2020-07-06 LAB — CBC
HCT: 21.8 % — ABNORMAL LOW (ref 36.0–46.0)
HCT: 20.9 % — ABNORMAL LOW (ref 36.0–46.0)
Hemoglobin: 7.7 g/dL — ABNORMAL LOW (ref 12.0–15.0)
Hemoglobin: 7.4 g/dL — ABNORMAL LOW (ref 12.0–15.0)
MCH: 30.6 pg (ref 26.0–34.0)
MCH: 30.5 pg (ref 26.0–34.0)
MCHC: 35.3 g/dL (ref 30.0–36.0)
MCHC: 35.4 g/dL (ref 30.0–36.0)
MCV: 86.5 fL (ref 80.0–100.0)
MCV: 86 fL (ref 80.0–100.0)
Platelets: 171 10*3/uL (ref 150–400)
Platelets: 154 10*3/uL (ref 150–400)
RBC: 2.52 MIL/uL — ABNORMAL LOW (ref 3.87–5.11)
RBC: 2.43 MIL/uL — ABNORMAL LOW (ref 3.87–5.11)
RDW: 13.8 % (ref 11.5–15.5)
RDW: 13.8 % (ref 11.5–15.5)
WBC: 15 10*3/uL — ABNORMAL HIGH (ref 4.0–10.5)
WBC: 12.3 10*3/uL — ABNORMAL HIGH (ref 4.0–10.5)
nRBC: 0 % (ref 0.0–0.2)
nRBC: 0 % (ref 0.0–0.2)

## 2020-07-06 LAB — TYPE AND SCREEN
ABO/RH(D): O POS
Antibody Screen: NEGATIVE
Unit division: 0
Unit division: 0

## 2020-07-06 LAB — COMPREHENSIVE METABOLIC PANEL
ALT: 48 U/L — ABNORMAL HIGH (ref 0–44)
ALT: 43 U/L (ref 0–44)
AST: 44 U/L — ABNORMAL HIGH (ref 15–41)
AST: 41 U/L (ref 15–41)
Albumin: 2.1 g/dL — ABNORMAL LOW (ref 3.5–5.0)
Albumin: 2.1 g/dL — ABNORMAL LOW (ref 3.5–5.0)
Alkaline Phosphatase: 77 U/L (ref 38–126)
Alkaline Phosphatase: 88 U/L (ref 38–126)
Anion gap: 8 (ref 5–15)
Anion gap: 9 (ref 5–15)
BUN: 6 mg/dL (ref 6–20)
BUN: 9 mg/dL (ref 6–20)
CO2: 20 mmol/L — ABNORMAL LOW (ref 22–32)
CO2: 21 mmol/L — ABNORMAL LOW (ref 22–32)
Calcium: 7.9 mg/dL — ABNORMAL LOW (ref 8.9–10.3)
Calcium: 8 mg/dL — ABNORMAL LOW (ref 8.9–10.3)
Chloride: 103 mmol/L (ref 98–111)
Chloride: 99 mmol/L (ref 98–111)
Creatinine, Ser: 1.3 mg/dL — ABNORMAL HIGH (ref 0.44–1.00)
Creatinine, Ser: 1.18 mg/dL — ABNORMAL HIGH (ref 0.44–1.00)
GFR, Estimated: 59 mL/min — ABNORMAL LOW (ref >60)
GFR, Estimated: >60 mL/min (ref >60)
Glucose, Bld: 129 mg/dL — ABNORMAL HIGH (ref 70–99)
Glucose, Bld: 100 mg/dL — ABNORMAL HIGH (ref 70–99)
Potassium: 3.7 mmol/L (ref 3.5–5.1)
Potassium: 3.6 mmol/L (ref 3.5–5.1)
Sodium: 131 mmol/L — ABNORMAL LOW (ref 135–145)
Sodium: 129 mmol/L — ABNORMAL LOW (ref 135–145)
Total Bilirubin: 1.7 mg/dL — ABNORMAL HIGH (ref 0.3–1.2)
Total Bilirubin: 0.6 mg/dL (ref 0.3–1.2)
Total Protein: 4.5 g/dL — ABNORMAL LOW (ref 6.5–8.1)
Total Protein: 4.4 g/dL — ABNORMAL LOW (ref 6.5–8.1)

## 2020-07-06 LAB — BPAM RBC
Blood Product Expiration Date: 202202102359
Blood Product Expiration Date: 202202102359
ISSUE DATE / TIME: 202201091839
ISSUE DATE / TIME: 202201092108
Unit Type and Rh: 5100
Unit Type and Rh: 5100

## 2020-07-06 MED ORDER — FENTANYL CITRATE (PF) 100 MCG/2ML IJ SOLN: 100.0000 ug | INTRAMUSCULAR | Status: DC | PRN

## 2020-07-06 MED ORDER — LACTATED RINGERS IV BOLUS
500.0000 mL | Freq: Once | INTRAVENOUS | Status: AC
  Administered 2020-07-06: 500 mL via INTRAVENOUS

## 2020-07-06 MED ORDER — LACTATED RINGERS IV BOLUS
1000.0000 mL | Freq: Once | INTRAVENOUS | Status: AC
  Administered 2020-07-06: 1000 mL via INTRAVENOUS

## 2020-07-06 NOTE — Progress Notes (Signed)
Subjective: POD#1 pLTCS  Patient is doing well without complaints. Has not yet ambulated. Foley catheter still in place. Passing flatus. Tolerating PO. Abdominal pain improved. Vaginal bleeding decreased.  Yesterday patient had hypotensive episode, she is feeling much better. She denies lightheadedness, dizziness, syncope. No cp, sob. No fever/chills.  RN called overnight regarding decreased UOP.    Objective: Vital signs in last 24 hours: Temp:  [97.9 F (36.6 C)-99.7 F (37.6 C)] 99.1 F (37.3 C) (01/10 0153) Pulse Rate:  [61-111] 76 (01/10 0005) Resp:  [12-23] 18 (01/10 0005) BP: (48-143)/(28-102) 128/89 (01/10 0005) SpO2:  [97 %-100 %] 99 % (01/10 0005)  Physical Exam:  General: alert, cooperative and no distress Lochia: appropriate Uterine Fundus: firm Incision: honeycomb dressing c/d/i DVT Evaluation: No evidence of DVT seen on physical exam.  Recent Labs    07/05/20 1018 07/05/20 1653  HGB 9.9* 6.3*  HCT 28.8* 18.1*    Assessment/Plan: POD#1 pLTCS  -doing well without complaints  -VSS  -breastfeeding  -discussed contraception, still undecided  #Decreased UOP  -571mL UOP in the past 12 hours, 47 mL/hr  -143mL/hr LR, 1L LR bolus as well  -urine in foley still dark  -continue IVF, monitor UOP  #type 2 DM  -continue metformin 500 mg daily  #gHTN  -hypotensive episode yesterday  -BP well controlled currently  -asymptomatic  -not on meds  #COVID +  -asymptomatic  -airborne precautions  #Acute on chronic blood loss anemia  -hgb 10.8>9.9>6.3  -s/p 2 U pRBCs, PO iron ordered  -asymptomatic  -repeat hgb this morning 7.7  #Transaminitis  -likely in the setting of COVID  -repeat CMP demonstrated LFTs stable  #AKI  -Cr 1.0-->1.3, likely in the setting of hypoperfusion  -continue LR  -monitor UOP  Plan for discharge tomorrow or Wednesday pending status of above conditions.    Arrie Senate 1/95/0932, 4:48 AM

## 2020-07-06 NOTE — Lactation Note (Signed)
This note was copied from a baby's chart. Lactation Consultation Note  Patient Name: Andrea Black'R Date: 07/06/2020   Age:24 hours  When I entered room, Mom was doing skin-to-skin, but Dad did skin-to-skin with infant so that I could do a breast assessment. Mom has larger breasts and flat nipples with small diameters. I noted she had pumped (twice) with size 27 flanges. Mom was provided with size 21 flanges. Mom reports that the nipple shield that was previously provided didn't help.   Mom reports + breast changes w/pregnancy. I assisted Mom in pumping and the size 21 flanges were noted to be a correct fit at this time. When I asked Mom how the size 21 flanges felt in comparison to the size 27 flanges, she replied, "great." No colostrum was expressed with the DEBP. I did hand expression with Mom; some drops were obtained, which was given to [place] on a clean gloved finger. Infant noted to have a good suck.  FOB was taught how to wash the pump parts.   I spoke with Mom about the possibility of supplementing in light of difficult feedings (either infant is not latching or does not maintain latch for long periods of time) and infant being [redacted] weeks gestation. Mom was agreeable to supplementing, but said she did not want to get "the bad kind of formula." When I clarified that, she said she didn't want the kind of formula that could cause NEC. I answered that any formula had the potential to do that & asked her if she would be willing to do PDM, instead. She signed the consent, but knows that she is not able to take PDM home with her. Mom wanted to supplement with a bottle.   After the consult, I called into the room to review the plan of care & spoke to Mom:   1. Offer the breast, but if infant does not latch or only latches for a few minutes, offer DBM. 2. Pump after DBM is provided. Wash pump parts and use Medela sanitizing spray (which was provided along with instructions).    Cyril Mourning, RN was given an update & will provide the extra-slow flow nipples for bottle use.   Mom has [place] and has a hands-free pump at home.  Matthias Hughs [facility] 07/06/2020, 1:41 PM

## 2020-07-06 NOTE — Lactation Note (Signed)
This note was copied from a baby's chart. Lactation Consultation Note Baby 28 hrs old. Mom has flat very compressible nipples. Mom denies painful latches.  LC hand expressed colostrum in container. Suggested to spoon feed baby for stimulation or desert. Demonstrated breast massage and hand expression. Praised mom for her colostrum. Encouraged mom to use DEBP. Mom is tired. Encouraged to rest.  Suggested pre-pumping before latching and use finger stimulation to soften breast tissue before latching.  Newborn feeding habits discussed. Cluster feeding discussed. FOB holding baby STS. Mom encouraged to feed baby 8-12 times/24 hours and with feeding cues.  Mom had no further questions at this time.  Patient Name: Andrea Black [id number] Date: 07/06/2020 Reason for consult: Follow-up assessment;Early term 37-38.6wks Age:24 hours  Maternal Data    Feeding Feeding Type: Breast Fed  LATCH Score       Type of Nipple: Flat  Comfort (Breast/Nipple): Soft / non-tender        Interventions Interventions: Breast massage;Hand express;Breast compression;Pre-pump if needed;Position options;Support pillows;Expressed milk  Lactation Tools Discussed/Used Pump Education: Milk Storage;Setup, frequency, and cleaning Initiated by:: Kalman Jewels, RN Date initiated:: 07/06/20   Consult Status Consult Status: Follow-up Date: 07/06/20 Follow-up type: In-patient    Theodoro Kalata 07/06/2020, 4:15 AM

## 2020-07-06 NOTE — Progress Notes (Signed)
Called OB regarding patient's decreased urine output of less than 30 cc/hr.  Resident aware and is coming to assess patient.  Will continue to monitor closely.

## 2020-07-06 NOTE — Lactation Note (Signed)
This note was copied from a baby's chart. Lactation Consultation Note Baby 44 hrs old. Mom called for [facility] assistance. Mom had baby latched and BF in football position when [facility] entered room. Good latch. Baby popped off, LC assisted back on. Gave mom shells to assist in everting flat nipples. Mom stated her nipples has always been flat. For now breast is very compressible and baby can latch. Mom may need a NS as breast start to fill and if baby isn't transferring enough milk. 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  Patient Name: Andrea Black [id number] Date: 07/06/2020 Reason for consult: Mother's request Age:55 hours  Maternal Data    Feeding Feeding Type: Breast Fed  LATCH Score Latch: Grasps breast easily, tongue down, lips flanged, rhythmical sucking.  Audible Swallowing: None  Type of Nipple: Flat  Comfort (Breast/Nipple): Soft / non-tender  Hold (Positioning): Assistance needed to correctly position infant at breast and maintain latch.  LATCH Score: 6  Interventions Interventions: Breast feeding basics reviewed;Adjust position;Assisted with latch;Support pillows;Skin to skin;Position options;Breast massage;Breast compression;Shells;Pre-pump if needed  Lactation Tools Discussed/Used Tools: Shells Shell Type: Inverted Pump Education: Milk Storage;Setup, frequency, and cleaning Initiated by:: Kalman Jewels, RN Date initiated:: 07/06/20   Consult Status Consult Status: Follow-up Date: 07/06/20 Follow-up type: In-patient    Andrea Black, Andrea Black 07/06/2020, 5:30 AM

## 2020-07-06 NOTE — Discharge Instructions (Signed)
[place] [phone]) . [place] o [address] [place], [place] [address] o [phone] o Mon-Fri 8:30-12:30, 1:30-5:00 o Accepting Medicaid . [place] at [facility] [place], [place], [place] [address] o [phone] o Mon-Fri 8:00-5:30 . [place] [place], [place] [address] o [phone], Tue, Thur, Fri 8:30-5:00, Wed 10:00-7:00 (closed 1-2pm) o Accepting Medicaid . [facility] o [address], [place], [place]  [address] o Phone - [phone]   Fax - [phone]  [place] [phone]) . [place] [place], [place] [address] o [phone] o Mon-Fri 8:00-5:00 . [facility] at [facility])  o [address] [place], [place] [address] o [phone] o Mon-Fri 8:30-5:30, Sat (Oct.-Mar.) 9:00-1:00 o Accepting [facility] [phone]) . [place] at [place], [place], [place] [address] o [phone] o Mon-Fri 8:00-5:00  [place] [phone]) . [place] at [place], [place], [place] [address] o [phone] o Mon-Fri 8:00-5:00 . Therapist, music at [place], [place], [place] [address] o [phone] o Mon-Fri 8:00-5:00 . Therapist, music at [place] [place], [place] [address] o [phone] o Mon-Fri 8:00-5:00 . [place]., [place] [address] o [phone] o Mon-Fri 7:30-5:30  [place] ([place]) . [place] [place], [place] [address] o [phone] o Mon-Thur 8:00-6:00 o Accepting Medicaid . [employer] [place], [place] [address] o [phone] o Mon-Thur 7:30-7:30, Fri 7:30-4:30 o Accepting Medicaid . [place] at [place] [address], [place], [place]  [address] o [phone]   Fax - [place] [phone] & [phone]) . Therapist, music at [place]., [place], [place] [address] o [phone] o Mon-Fri 7:00-5:00 . [place], [place], [place] [address] o [phone] o Mon-Fri 8:00-5:00 o Accepting Medicaid . [place], [place], [place] [address] o [phone] o Mon-Fri 8:00-5:00 o Accepting Medicaid  [facility] [place] [phone]) . [facility] o [place] [place], [place] [address] o [phone] o Mon-Fri 8:00-5:00 . [place] ([place] at [employer]) o [address] [place], [place], [place] [address] o [phone] o Mon-Fri 8:00-5:00 o Accepting Medicaid . [place] ([facility] [employer]) o [address] [place], [place], [place] [address] o [phone] o Mon-Fri 8:00-5:30, Sat&Sun by appointment (phones open at 8:30) o Accepting [facility] [phone] & [phone]) . [facility] o [address] [place], [place] [address] o [phone] o Mon-Thur 8:00-7:00, Fri 8:00-5:00, Sat 8:00-12:00, Sun 9:00-12:00 o Accepting Medicaid . [facility] at [facility] [address], [place], [place] [address] o [phone] o Mon-Thur 8:00-5:00 o Accepting Medicaid . [facility] at [place]., [place],  [address] o [phone] o Mon-Fri 8:00-5:30, Sat (Oct.-Mar.) 9:00-1:00 o Accepting [employer]  [phone]) .  [place] o [address] [place], [place], [place] [address] o [phone] o Mon-Fri 8:00-5:00 o Accepting Medicaid   [facility] [phone]) . [place] at [place], [place], [place] [address] o [phone] o Mon-Fri 8:00-5:00 . Therapist, music at [facility] o [address], [place], [place] [address] o [phone] o Mon-Fri 8:00-5:00 . [place] [address], [place], [place] [address] o [phone] o Mon-Fri 8:00-5:00 o After hours clinic [facility] [place], [place] [address]) [phone] Mon-Fri 5:00-8:00, Sat 12:00-6:00, Sun 10:00-4:00 o Accepting Medicaid . [place] at [facility] o [address], [place], [place]  [address] o [phone]   Fax - [phone]  [place] [phone]) . Therapist, music at [place] o [address] [place], [place] [address] o [phone] o Mon-Fri 8:00-5:00 . [place] ([place] at [place]) o [place] [address], [place], [place] [address] o [phone] o Mon-Thur 8:00-7:00, Fri 8:00-5:00, Sat 8:00-12:00      Postpartum Care After Cesarean Delivery This sheet gives you information about how to care for yourself from the time you deliver your baby to up to 6-12 weeks after delivery (postpartum period). Your health care provider may also give you more specific instructions. If you have problems or questions, contact your health care provider. Follow these instructions at home: Medicines  Take over-the-counter and prescription medicines only as told by your health care provider.  If you were prescribed an antibiotic medicine, take it as told by your health care provider. Do not stop taking the antibiotic even if you start to feel better.  Ask your health care provider if the medicine prescribed to you: ? Requires you to avoid driving or using  heavy machinery. ? Can cause constipation. You may need to take actions to prevent or treat constipation, such as:  Drink enough fluid to keep your urine pale yellow.  Take over-the-counter or prescription medicines.  Eat foods that are high in fiber, such as beans, whole grains, and fresh fruits and vegetables.  Limit foods that are high in fat and processed sugars, such as fried or sweet foods. Activity  Gradually return to your normal activities as told by your health care provider.  Avoid activities that take a lot of effort and energy (are strenuous) until approved by your health care provider. Walking at a slow to moderate pace is usually safe. Ask your health care provider what activities are safe for you. ? Do not lift anything that is heavier than your baby or 10 lb (4.5 kg) as told by your health care provider. ? Do not vacuum, climb stairs, or drive a car for as long as told by your health care provider.  If possible, have someone help you at home until you are able to do your usual activities yourself.  Rest as much as possible. Try to rest or take naps while your baby is sleeping. Vaginal bleeding  It is normal to have vaginal bleeding (lochia) after delivery. Wear a sanitary pad to absorb vaginal bleeding and discharge. ? During the first week after delivery, the amount and appearance of lochia is often similar to a menstrual period. ? Over the next few weeks, it will gradually decrease to a dry, yellow-brown discharge. ? For most women, lochia stops completely by 4-6 weeks after delivery. Vaginal bleeding can vary from  woman to woman.  Change your sanitary pads frequently. Watch for any changes in your flow, such as: ? A sudden increase in volume. ? A change in color. ? Large blood clots.  If you pass a blood clot, save it and call your health care provider to discuss. Do not flush blood clots down the toilet before you get instructions from your health care  provider.  Do not use tampons or douches until your health care provider says this is safe.  If you are not breastfeeding, your period should return 6-8 weeks after delivery. If you are breastfeeding, your period may return anytime between 8 weeks after delivery and the time that you stop breastfeeding. Perineal care  If your C-section (Cesarean section) was unplanned, and you were allowed to labor and push before delivery, you may have pain, swelling, and discomfort of the tissue between your vaginal opening and your anus (perineum). You may also have an incision in the tissue (episiotomy) or the tissue may have torn during delivery. Follow these instructions as told by your health care provider: ? Keep your perineum clean and dry as told by your health care provider. Use medicated pads and pain-relieving sprays and creams as directed. ? If you have an episiotomy or vaginal tear, check the area every day for signs of infection. Check for:  Redness, swelling, or pain.  Fluid or blood.  Warmth.  Pus or a bad smell. ? You may be given a squirt bottle to use instead of wiping to clean the perineum area after you go to the bathroom. As you start healing, you may use the squirt bottle before wiping yourself. Make sure to wipe gently. ? To relieve pain caused by an episiotomy, vaginal tear, or hemorrhoids, try taking a warm sitz bath 2-3 times a day. A sitz bath is a warm water bath that is taken while you are sitting down. The water should only come up to your hips and should cover your buttocks.   Breast care  Within the first few days after delivery, your breasts may feel heavy, full, and uncomfortable (breast engorgement). You may also have milk leaking from your breasts. Your health care provider can suggest ways to help relieve breast discomfort. Breast engorgement should go away within a few days.  If you are breastfeeding: ? Wear a bra that supports your breasts and fits you well. ? Keep  your nipples clean and dry. Apply creams and ointments as told by your health care provider. ? You may need to use breast pads to absorb milk leakage. ? You may have uterine contractions every time you breastfeed for several weeks after delivery. Uterine contractions help your uterus return to its normal size. ? If you have any problems with breastfeeding, work with your health care provider or a Science writer.  If you are not breastfeeding: ? Avoid touching your breasts as this can make your breasts produce more milk. ? Wear a well-fitting bra and use cold packs to help with swelling. ? Do not squeeze out (express) milk. This causes you to make more milk. Intimacy and sexuality  Ask your health care provider when you can engage in sexual activity. This may depend on your: ? Risk of infection. ? Healing rate. ? Comfort and desire to engage in sexual activity.  You are able to get pregnant after delivery, even if you have not had your period. If desired, talk with your health care provider about methods of family planning or birth control (  contraception). Lifestyle  Do not use any products that contain nicotine or tobacco, such as cigarettes, e-cigarettes, and chewing tobacco. If you need help quitting, ask your health care provider.  Do not drink alcohol, especially if you are breastfeeding. Eating and drinking  Drink enough fluid to keep your urine pale yellow.  Eat high-fiber foods every day. These may help prevent or relieve constipation. High-fiber foods include: ? Whole grain cereals and breads. ? Brown rice. ? Beans. ? Fresh fruits and vegetables.  Take your prenatal vitamins until your postpartum checkup or until your health care provider tells you it is okay to stop.   General instructions  Keep all follow-up visits for you and your baby as told by your health care provider. Most women visit their health care provider for a postpartum checkup within the first 3-6  weeks after delivery. Contact a health care provider if you:  Feel unable to cope with the changes that a new baby brings to your life, and these feelings do not go away.  Feel unusually sad or worried.  Have breasts that are painful, hard, or turn red.  Have a fever.  Have trouble holding urine or keeping urine from leaking.  Have little or no interest in activities you used to enjoy.  Have not breastfed at all and you have not had a menstrual period for 12 weeks after delivery.  Have stopped breastfeeding and you have not had a menstrual period for 12 weeks after you stopped breastfeeding.  Have questions about caring for yourself or your baby.  Pass a blood clot from your vagina. Get help right away if you:  Have chest pain.  Have difficulty breathing.  Have sudden, severe leg pain.  Have severe pain or cramping in your abdomen.  Bleed from your vagina so much that you fill more than one sanitary pad in one hour. Bleeding should not be heavier than your heaviest period.  Develop a severe headache.  Faint.  Have blurred vision or spots in your vision.  Have a bad-smelling vaginal discharge.  Have thoughts about hurting yourself or your baby. If you ever feel like you may hurt yourself or others, or have thoughts about taking your own life, get help right away. You can go to your nearest emergency department or call:  Your local emergency services (911 in the [place]  A suicide crisis helpline, such as the [place] at (367) 402-4456. This is open 24 hours a day. Summary  The period of time from when you deliver your baby to up to 6-12 weeks after delivery is called the postpartum period.  Gradually return to your normal activities as told by your health care provider.  Keep all follow-up visits for you and your baby as told by your health care provider. This information is not intended to replace advice given to you by your health  care provider. Make sure you discuss any questions you have with your health care provider. Document Revised: 01/31/2018 Document Reviewed: 01/31/2018 Elsevier Patient Education  [place].

## 2020-07-06 NOTE — Clinical Social Work Maternal (Signed)
CLINICAL SOCIAL WORK MATERNAL/CHILD NOTE  Patient Details  Name: Andrea Black MRN: [id number] Date of Birth: 08/15/1995  Date:  07/06/2020  Clinical Social Worker Initiating Note:  Darra Lis, MSW, [place] Date/Time: Initiated:  07/06/20/0905     Child's Name:  Andrea Black   Biological Parents:  Mother,Father Andrea Black 06/30/1995)   Need for Interpreter:  None   Reason for Referral:  Current Substance Use/Substance Use During Pregnancy ,Behavioral Health Concerns   Address:  [place] [address]    Phone number:  [phone] (home)     Additional phone number:   Household Members/Support Persons (HM/SP):   Household Member/Support Person 1   HM/SP Name Relationship DOB or Age  HM/SP -1 Andrea Black Spouse 06/30/1995  HM/SP -2        HM/SP -3        HM/SP -4        HM/SP -5        HM/SP -6        HM/SP -7        HM/SP -8          Natural Supports (not living in the home):  Immediate Family   Professional Supports: None   Employment: Part-time   Type of Work: Scientist, 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   Homebound arranged:    Museum/gallery curator Resources:  Medicaid   Other Resources:  [facility]   Cultural/Religious Considerations Which May Impact Care:    Strengths:  Ability to meet basic needs ,Pediatrician chosen,Home prepared for child    Psychotropic Medications:         Pediatrician:    [facility] area  Pediatrician List:   [facility]  [place]      Pediatrician Fax Number:    Risk Factors/Current Problems:  None   Cognitive State:  Alert    Mood/Affect:  Calm ,Interested    CSW Assessment: CSW consulted for history of depression and THC use. CSW contacted MOB by phone due to Covid positive status, to complete assessment and offer support. CSW informed MOB of reason for consult. MOB reported she lives with  FOB. MOB works part-time at [employer] and receives [employer]. MOB disclosed a diagnosis of depression dating back to 70. MOB stated she was diagnosed the year her father passed away. MOB expressed she experienced some depression during the pregnancy due to some stressors that have since subsided. MOB reported she is currently feeling fine. MOB stated she has never been on medication but attended therapy in 2015, which was helpful. MOB identified FOB and her mother as supports. MOB denies any current SI, HI or DV.   CSW asked MOB about substance use history. MOB reported she has not used substances during the pregnancy. MOB stated she had a history of THC use prior to pregnancy and again denied any THC use during pregnancy. CSW reviewed chart and MOB has no positive UDS screens since pregnancy.   CSW discussed baby blues period versus perinatal mood disorders with MOB . CSW provided resources for mental health follow-up if concerns arise. CSW discussed self-evaluation during the postpartum time period and provided MOB with New Mom Checklist. MOB was encouraged to contact a professional if symptoms arise.  CSW provided review of Sudden Infant Death Syndrome (SIDS) precautions.  MOB reported baby will sleep in a bassinet. MOB stated she  has all essential needs for baby, including a car seat. MOB denies any transportation barriers to follow-up care. MOB declined community referrals and reported she will make aware if interested. MOB expressed no additional needs at this time.   CSW will continue to follow CDS and make a CPS report if warranted. CSW identifies no further need for intervention and no barriers to discharge at this time.   CSW Plan/Description:  CSW Will Continue to Monitor Umbilical Cord Tissue Drug Screen Results and Make Report if Warranted,Child Protective Service Report ,Perinatal Mood and Anxiety Disorder (PMADs) Education,Sudden Infant Death Syndrome (SIDS) Education,No Further Intervention  Required/No Barriers to [employer] Patient/Family Education    Waylan Boga, [place] 07/06/2020, 9:24 AM

## 2020-07-07 ENCOUNTER — Other Ambulatory Visit (HOSPITAL_COMMUNITY): Payer: Self-pay | Admitting: Obstetrics and Gynecology

## 2020-07-07 DIAGNOSIS — E669 Obesity, unspecified: Secondary | ICD-10-CM

## 2020-07-07 DIAGNOSIS — O99893 Other specified diseases and conditions complicating puerperium: Secondary | ICD-10-CM

## 2020-07-07 DIAGNOSIS — E282 Polycystic ovarian syndrome: Secondary | ICD-10-CM

## 2020-07-07 DIAGNOSIS — O9833 Other infections with a predominantly sexual mode of transmission complicating the puerperium: Secondary | ICD-10-CM

## 2020-07-07 DIAGNOSIS — O2413 Pre-existing diabetes mellitus, type 2, in the puerperium: Secondary | ICD-10-CM

## 2020-07-07 DIAGNOSIS — B009 Herpesviral infection, unspecified: Secondary | ICD-10-CM

## 2020-07-07 DIAGNOSIS — Z98891 History of uterine scar from previous surgery: Secondary | ICD-10-CM

## 2020-07-07 DIAGNOSIS — O99215 Obesity complicating the puerperium: Secondary | ICD-10-CM

## 2020-07-07 DIAGNOSIS — E119 Type 2 diabetes mellitus without complications: Secondary | ICD-10-CM

## 2020-07-07 MED ORDER — COCONUT OIL OIL: 1.0000 "application " | TOPICAL_OIL | 0 refills | Status: DC | PRN

## 2020-07-07 MED ORDER — ACETAMINOPHEN 500 MG PO TABS: 1000.0000 mg | ORAL_TABLET | Freq: Three times a day (TID) | ORAL | 0 refills | Status: DC

## 2020-07-07 MED ORDER — POLYETHYLENE GLYCOL 3350 17 GM/SCOOP PO POWD: 1.0000 | Freq: Once | ORAL | 0 refills | Status: DC

## 2020-07-07 MED ORDER — FERROUS SULFATE 325 (65 FE) MG PO TABS: 325.0000 mg | ORAL_TABLET | ORAL | 1 refills | Status: DC

## 2020-07-07 MED ORDER — METFORMIN HCL 500 MG PO TABS: 500.0000 mg | ORAL_TABLET | Freq: Two times a day (BID) | ORAL | 1 refills | Status: DC

## 2020-07-07 MED ORDER — POLYETHYLENE GLYCOL 3350 17 G PO PACK
17.0000 g | PACK | Freq: Every day | ORAL | Status: DC
  Administered 2020-07-07: 17 g via ORAL
  Filled 2020-07-07: qty 1

## 2020-07-07 MED ORDER — OXYCODONE HCL 5 MG PO TABS: 5.0000 mg | ORAL_TABLET | ORAL | 0 refills | Status: DC | PRN

## 2020-07-07 MED ORDER — AMLODIPINE BESYLATE 5 MG PO TABS: 5.0000 mg | ORAL_TABLET | Freq: Every day | ORAL | 0 refills | Status: DC

## 2020-07-07 MED FILL — ACETAMINOPHEN 500MG XT STRE: 500 | 5 days supply | Qty: 30 | Fill #0

## 2020-07-07 MED FILL — oxyCODONE HCL 5 MG TABS: 5 | 3 days supply | Qty: 15 | Fill #0

## 2020-07-07 MED FILL — AMLODIPINE BESYLATE 5 MG TA: 5 | 30 days supply | Qty: 30 | Fill #0

## 2020-07-07 MED FILL — POLYETHYLENE GLYCOL 3350 PO: 17 | 30 days supply | Qty: 510 | Fill #0

## 2020-07-07 MED FILL — metFORMIN HCL 500 MG TABS: 500 | 30 days supply | Qty: 60 | Fill #0

## 2020-07-07 MED FILL — FERROUS SULFATE 325 MG TAB: 325 (65 FE) | 180 days supply | Qty: 90 | Fill #0

## 2020-07-07 NOTE — Lactation Note (Signed)
This note was copied from a baby's chart. Lactation Consultation Note  Patient Name: Andrea Black CHYIF'O Date: 07/07/2020   Age:24 hours  I spoke with Heide Guile, RN who said that Mom will now be supplementing with formula (her feeding choice was breast/formula). Per Maudie Mercury, Mom is not getting much with pumping. Mom plans to find the correct flange size that will fit her pump at home (she has been using size 21 flanges here).   I called into patient's room to see if she would like a lactation visit, but there was no answer after a number of rings. I will attempt to make contact with patient later.    Matthias Hughs [facility] 07/07/2020, 8:56 AM

## 2020-07-07 NOTE — Lactation Note (Addendum)
This note was copied from a baby's chart. Lactation Consultation Note  Patient Name: Girl Skyleigh Windle QTMAU'Q Date: 07/07/2020 Reason for consult: Follow-up assessment;Primapara;Early term 37-38.6wks Age:24 hours  Mom last pumped last night. Mom offered the breast this morning & Alina sustained suckling for 5 minutes, which is the longest length of time she has latched without coming off.   Hand expression was reviewed with Mom & the resulting drops were given to infant on a clean gloved finger. I encouraged Mom to at least do hand expression (if she is not in the mood to pump). Mom & I talked about lactogenic foods & her other questions were answered to her satisfaction.   Mom has Avent bottles 0+ months at home. We talked about the sounds/signs of safe swallowing with bottle feeding. Mom knows that if she has concerns about the 0+ month bottles, she could go down a level, if needed.     At the end of the consult, Mom became nauseous and I provided her with a basin & called her nurse, Heide Guile, RN. Maudie Mercury was quickly into room.   Meanwhile, I talked to Dad about proper mouth placement on the bottle teat and how to crimp the nipple in infant's mouth if he thinks she is swallowing too quickly. Dad knows to feed her until she is content (Mom & I had discussed volumes earlier). Dad was also reminded to use the Medela sanitizing spray after every washing while Mom has COVID.   Matthias Hughs [facility] 07/07/2020, 12:51 PM

## 2020-07-08 LAB — SURGICAL PATHOLOGY

## 2020-07-09 ENCOUNTER — Encounter: Payer: Medicaid Other | Admitting: Obstetrics and Gynecology

## 2020-07-09 ENCOUNTER — Ambulatory Visit: Payer: Medicaid Other

## 2020-07-15 ENCOUNTER — Encounter: Payer: Medicaid Other | Admitting: Obstetrics and Gynecology

## 2020-07-15 ENCOUNTER — Other Ambulatory Visit: Payer: Self-pay

## 2020-07-15 ENCOUNTER — Ambulatory Visit: Payer: Medicaid Other

## 2020-07-15 VITALS — BP 123/83 | HR 73

## 2020-07-15 DIAGNOSIS — Z0130 Encounter for examination of blood pressure without abnormal findings: Secondary | ICD-10-CM

## 2020-07-15 NOTE — Progress Notes (Signed)
Patient was assessed and managed by nursing staff during this encounter. I have reviewed the chart and agree with the documentation and plan. I have also made any necessary editorial changes.  Griffin Basil, MD 07/15/2020 4:48 PM

## 2020-07-15 NOTE — Progress Notes (Signed)
..  Subjective:  Andrea Black is a 25 y.o. female here for BP check.  Pt states that she has not taken Amlodipine in 2-3 days because she forgot.  Hypertension ROS: not taking medications regularly as instructed, no medication side effects noted, no TIA's, no chest pain on exertion, no dyspnea on exertion and no swelling of ankles.    Objective:  BP 123/83   Pulse 73   Appearance alert, well appearing, and in no distress. General exam BP noted to be well controlled today in office.  Incision is clean, dry and intact   Assessment:   Blood Pressure well controlled.   Plan:  Per in office provider, pt cand/c Amlodipine since BP is normal. Advised pt to report any abnormal symptoms and f/u for pp visit on 08-18-20

## 2020-07-24 ENCOUNTER — Encounter: Payer: Medicaid Other | Admitting: Obstetrics and Gynecology

## 2020-07-28 ENCOUNTER — Telehealth: Payer: Self-pay

## 2020-07-28 NOTE — Telephone Encounter (Signed)
RTC to pt regarding vm of possible infection at incision  Pt states wound is open and has odor No bleeding, no drainage, pain 2/10 Pt advised will need incision check Pt info sent to front desk to make appt Pt agreeable and voiced understanding.  PP is 08/19/19

## 2020-07-29 ENCOUNTER — Ambulatory Visit (INDEPENDENT_AMBULATORY_CARE_PROVIDER_SITE_OTHER): Payer: Medicaid Other

## 2020-07-29 ENCOUNTER — Other Ambulatory Visit: Payer: Self-pay

## 2020-07-29 DIAGNOSIS — Z5189 Encounter for other specified aftercare: Secondary | ICD-10-CM | POA: Diagnosis not present

## 2020-07-29 NOTE — Progress Notes (Signed)
Subjective:     Andrea Black is a 25 y.o. female who presents to the clinic 3 weeks status post LTCS for Incision check. Eating a regular diet without difficulty. Bowel movements are normal. The patient is not having any pain.  Review of Systems Pertinent items are noted in HPI.    Objective:    BP 127/85   Pulse 94   Wt 194 lb (88 kg)   Breastfeeding Yes   BMI 35.48 kg/m  General:  alert and no distress  Abdomen: soft, bowel sounds active, non-tender  Incision:   healing well, no drainage, no erythema, no hernia, no seroma, no swelling, no dehiscence, incision well approximated     Assessment:    Doing well postoperatively.   Plan:    1. Continue any current medications. 2. Wound care discussed. 3. Activity restrictions: none 4. Anticipated return to work: not applicable. 5. Follow up: 3 weeks for PP visit 08/18/2020.   Tamela Oddi, RMA

## 2020-07-29 NOTE — Progress Notes (Signed)
Patient ID: Andrea Black, female   DOB: October 09, 1995, 25 y.o.   MRN: [id number] Patient was assessed and managed by nursing staff during this encounter. I have reviewed the chart and agree with the documentation and plan. I have also made any necessary editorial changes.  Emeterio Reeve, MD 07/29/2020 11:33 AM

## 2020-08-18 ENCOUNTER — Other Ambulatory Visit: Payer: Medicaid Other

## 2020-08-18 ENCOUNTER — Other Ambulatory Visit: Payer: Self-pay

## 2020-08-18 ENCOUNTER — Encounter: Payer: Self-pay | Admitting: Obstetrics and Gynecology

## 2020-08-18 ENCOUNTER — Ambulatory Visit (INDEPENDENT_AMBULATORY_CARE_PROVIDER_SITE_OTHER): Payer: Medicaid Other | Admitting: Obstetrics and Gynecology

## 2020-08-18 NOTE — Progress Notes (Signed)
[facility] Partum Visit Note  Andrea Black is a 25 y.o. G95P1001 female who presents for a postpartum visit. She is 6 weeks postpartum following a primary cesarean section.  I have fully reviewed the prenatal and intrapartum course. The delivery was at 37.4 gestational weeks due to failure to progress and GHTN.  Anesthesia: spinal. Postpartum course has been uncomplicated. Baby is doing well. Baby is feeding by bottle - Enfamil AR. Bleeding no bleeding. Bowel function is normal. Bladder function is normal. Patient is not sexually active. Contraception method is abstinence. Postpartum depression screening: negative.   The pregnancy intention screening data noted above was reviewed. Potential methods of contraception were discussed. The patient elected to proceed with Female Condom.    Edinburgh Postnatal Depression Scale - 08/18/20 0855      Edinburgh Postnatal Depression Scale:  In the Past 7 Days   I have been able to laugh and see the funny side of things. 0    I have looked forward with enjoyment to things. 0    I have blamed myself unnecessarily when things went wrong. 1    I have been anxious or worried for no good reason. 0    I have felt scared or panicky for no good reason. 0    Things have been getting on top of me. 0    I have been so unhappy that I have had difficulty sleeping. 0    I have felt sad or miserable. 0    I have been so unhappy that I have been crying. 0    The thought of harming myself has occurred to me. 0    Edinburgh Postnatal Depression Scale Total 1               Review of Systems Pertinent items noted in HPI and remainder of comprehensive ROS otherwise negative.    Objective:  BP (!) 138/94   Pulse 72   Wt 209 lb 1.6 oz (94.8 kg)   BMI 38.24 kg/m    General:  alert, cooperative and no distress   Breasts:  inspection negative, no nipple discharge or bleeding, no masses or nodularity palpable  Lungs: clear to auscultation bilaterally  Heart:   regular rate and rhythm  Abdomen: soft, non-tender; bowel sounds normal; no masses,  no organomegaly  Incision healed without erythema, induration or drainage   Vulva:  normal        Assessment:    Normal postpartum exam. Pap smear not done at today's visit.   Plan:   Essential components of care per ACOG recommendations:  1.  Mood and well being: Patient with negative depression screening today. Reviewed local resources for support.    2. Infant care and feeding:  -Patient currently breastmilk feeding? No  -Social determinants of health (SDOH) reviewed in EPIC. No concerns  3. Sexuality, contraception and birth spacing - Patient does not want a pregnancy in the next year.    - Reviewed forms of contraception in tiered fashion. Patient desired condoms today.   - Discussed birth spacing of 18 months  4. Sleep and fatigue -Encouraged family/partner/community support of 4 hrs of uninterrupted sleep to help with mood and fatigue  5. Physical Recovery  - Discussed patients delivery and complications - Patient is safe to resume physical and sexual activity  6.  Health Maintenance - Last pap smear done 08/2018 and was normal with negative HPV.   7. Chronic Disease - PCP referral made for management of HTN  and DM - Encouraged patient to continue diabetic diet for now - Borderline HTN not currently on medication  Mora Bellman, MD [facility], [place]

## 2021-08-24 ENCOUNTER — Ambulatory Visit (HOSPITAL_COMMUNITY)
Admission: EM | Admit: 2021-08-24 | Discharge: 2021-08-24 | Disposition: A | Discharge status: Home / Self Care | Payer: Medicaid Other | Attending: Urgent Care | Admitting: Urgent Care

## 2021-08-24 ENCOUNTER — Encounter (HOSPITAL_COMMUNITY): Payer: Self-pay | Admitting: Emergency Medicine

## 2021-08-24 ENCOUNTER — Other Ambulatory Visit: Payer: Self-pay

## 2021-08-24 DIAGNOSIS — Z8616 Personal history of COVID-19: Secondary | ICD-10-CM | POA: Diagnosis not present

## 2021-08-24 DIAGNOSIS — R0981 Nasal congestion: Secondary | ICD-10-CM | POA: Insufficient documentation

## 2021-08-24 DIAGNOSIS — U071 COVID-19: Secondary | ICD-10-CM | POA: Insufficient documentation

## 2021-08-24 DIAGNOSIS — R52 Pain, unspecified: Secondary | ICD-10-CM | POA: Insufficient documentation

## 2021-08-24 DIAGNOSIS — J069 Acute upper respiratory infection, unspecified: Secondary | ICD-10-CM | POA: Diagnosis not present

## 2021-08-24 DIAGNOSIS — B348 Other viral infections of unspecified site: Secondary | ICD-10-CM | POA: Insufficient documentation

## 2021-08-24 LAB — RESPIRATORY PANEL BY PCR

## 2021-08-24 LAB — SARS CORONAVIRUS 2 (TAT 6-24 HRS): SARS Coronavirus 2: NEGATIVE

## 2021-08-24 MED ORDER — BENZONATATE 100 MG PO CAPS
100.0000 mg | ORAL_CAPSULE | Freq: Three times a day (TID) | ORAL | 0 refills | Status: DC | PRN
Start: 2021-08-24 — End: 2022-03-27

## 2021-08-24 MED ORDER — PSEUDOEPHEDRINE HCL 60 MG PO TABS: 60.0000 mg | ORAL_TABLET | Freq: Three times a day (TID) | ORAL | 0 refills | Status: DC | PRN

## 2021-08-24 MED ORDER — CETIRIZINE HCL 10 MG PO TABS: 10.0000 mg | ORAL_TABLET | Freq: Every day | ORAL | 0 refills | Status: DC

## 2021-08-24 MED ORDER — PROMETHAZINE-DM 6.25-15 MG/5ML PO SYRP: 5.0000 mL | ORAL_SOLUTION | Freq: Every evening | ORAL | 0 refills | Status: DC | PRN

## 2021-08-24 NOTE — ED Triage Notes (Signed)
Pt reports cough, body aches, sore throat, headache and nasal congestion beginning yesterday. Took a home covid test with negative results.

## 2021-08-24 NOTE — ED Provider Notes (Signed)
[facility]   MRN: [id number] DOB: [birth year]  Subjective:   Andrea Black is a 26 y.o. female presenting for 1 day history of acute onset sinus congestion, sinus headaches, throat pain, body aches, coughing.  No chest pain, shortness of breath or wheezing, ear pain, painful swallowing.  Patient is not a smoker.  No history of asthma.  She had COVID-19 over a year ago.  Would like to be checked for all the respiratory illnesses that we can look at.  Denies taking any current medications.  No current facility-administered medications for this encounter.  Current Outpatient Medications:    amLODipine (NORVASC) 5 MG tablet, TAKE 1 TABLET (5 MG TOTAL) BY MOUTH DAILY., Disp: 30 tablet, Rfl: 0   ferrous sulfate 325 (65 FE) MG tablet, TAKE 1 TABLET (325 MG TOTAL) BY MOUTH EVERY OTHER DAY. (Patient not taking: No sig reported), Disp: 90 tablet, Rfl: 1   metFORMIN (GLUCOPHAGE) 500 MG tablet, TAKE 1 TABLET (500 MG TOTAL) BY MOUTH TWO TIMES DAILY WITH A MEAL. (Patient not taking: No sig reported), Disp: 90 tablet, Rfl: 1   Prenatal Vit-Fe Fumarate-FA (MULTIVITAMIN-PRENATAL) 27-0.8 MG TABS tablet, Take 1 tablet by mouth daily at 12 noon., Disp: , Rfl:    No Known Allergies  Past Medical History:  Diagnosis Date   Anemia    Depression    Diabetes mellitus without complication ([place])    pre-diabetic    Headache in pregnancy, antepartum, second trimester 02/12/2020   Polycystic ovary      Past Surgical History:  Procedure Laterality Date   CESAREAN SECTION N/A 07/05/2020   Procedure: CESAREAN SECTION;  Surgeon: Aletha Halim, MD;  Location: [facility];  Service: Obstetrics;  Laterality: N/A;   WISDOM TOOTH EXTRACTION      Family History  Problem Relation Age of Onset   Colon cancer Mother    Diabetes Mother    Arthritis Mother    Cancer Mother    Depression Mother    Stroke Mother    Heart disease Father    Asthma Father    Hypertension Father    Polycystic  ovary syndrome Paternal Grandmother    Alcohol abuse Brother     Social History   Tobacco Use   Smoking status: Former    Types: Cigarettes    Quit date: 11/04/2019    Years since quitting: 1.8   Smokeless tobacco: Never  Vaping Use   Vaping Use: Never used  Substance Use Topics   Alcohol use: No    Alcohol/week: 0.0 standard drinks   Drug use: Yes    Types: Marijuana    ROS   Objective:   Vitals: BP 122/83 (BP Location: Right Arm)    Pulse 81    Temp 98.4 F (36.9 C) (Oral)    Resp 16    Ht 5\' 2"  (1.575 m)    Wt 208 lb 15.9 oz (94.8 kg)    SpO2 96%    BMI 38.23 kg/m   Physical Exam Constitutional:      General: She is not in acute distress.    Appearance: Normal appearance. She is well-developed and normal weight. She is not ill-appearing, toxic-appearing or diaphoretic.  HENT:     Head: Normocephalic and atraumatic.     Right Ear: Tympanic membrane, ear canal and external ear normal. No drainage or tenderness. No middle ear effusion. There is no impacted cerumen. Tympanic membrane is not erythematous.     Left Ear: Tympanic membrane,  ear canal and external ear normal. No drainage or tenderness.  No middle ear effusion. There is no impacted cerumen. Tympanic membrane is not erythematous.     Nose: Nose normal. No congestion or rhinorrhea.     Mouth/Throat:     Mouth: Mucous membranes are moist. No oral lesions.     Pharynx: No pharyngeal swelling, oropharyngeal exudate, posterior oropharyngeal erythema or uvula swelling.     Tonsils: No tonsillar exudate or tonsillar abscesses.  Eyes:     General: No scleral icterus.       Right eye: No discharge.        Left eye: No discharge.     Extraocular Movements: Extraocular movements intact.     Right eye: Normal extraocular motion.     Left eye: Normal extraocular motion.     Conjunctiva/sclera: Conjunctivae normal.  Cardiovascular:     Rate and Rhythm: Normal rate.     Heart sounds: No murmur heard.   No friction rub.  No gallop.  Pulmonary:     Effort: Pulmonary effort is normal. No respiratory distress.     Breath sounds: No stridor. No wheezing, rhonchi or rales.  Chest:     Chest wall: No tenderness.  Musculoskeletal:     Cervical back: Normal range of motion and neck supple.  Lymphadenopathy:     Cervical: No cervical adenopathy.  Skin:    General: Skin is warm and dry.  Neurological:     General: No focal deficit present.     Mental Status: She is alert and oriented to person, place, and time.  Psychiatric:        Mood and Affect: Mood normal.        Behavior: Behavior normal.    Assessment and Plan :   PDMP not reviewed this encounter.  1. Viral URI with cough   2. Body aches   3. Sinus congestion    Deferred imaging given clear cardiopulmonary exam, hemodynamically stable vital signs. Respiratory panel pending. Will manage for viral illness such as viral URI, viral syndrome, viral rhinitis, COVID-19, influenza. Recommended supportive care. Offered scripts for symptomatic relief. Testing is pending. Counseled patient on potential for adverse effects with medications prescribed/recommended today, ER and return-to-clinic precautions discussed, patient verbalized understanding.      Jaynee Eagles, [place] 08/24/21 1258

## 2021-08-24 NOTE — Discharge Instructions (Addendum)
We will notify you of your test results as they arrive and may take between 48-72 hours.  I encourage you to sign up for MyChart if you have not already done so as this can be the easiest way for [place] to communicate results to you online or through a phone app.  Generally, we only contact you if it is a positive test result.  In the meantime, if you develop worsening symptoms including fever, chest pain, shortness of breath despite our current treatment plan then please report to the emergency room as this may be a sign of worsening status from possible viral infection.  Otherwise, we will manage this as a viral syndrome. For sore throat or cough try using a honey-based tea. Use 3 teaspoons of honey with juice squeezed from half lemon. Place shaved pieces of ginger into 1/2-1 cup of water and warm over stove top. Then mix the ingredients and repeat every 4 hours as needed. Please take Tylenol 500mg -650mg  every 6 hours for aches and pains, fevers. Hydrate very well with at least 2 liters of water. Eat light meals such as soups to replenish electrolytes and soft fruits, veggies. Start an antihistamine like Zyrtec for postnasal drainage, sinus congestion.  You can take this together with pseudoephedrine (Sudafed) at a dose of 60 mg 2-3 times a day as needed for the same kind of congestion.  Use the cough medications as needed.

## 2021-10-18 ENCOUNTER — Other Ambulatory Visit: Payer: Self-pay | Admitting: Obstetrics

## 2022-03-22 ENCOUNTER — Emergency Department (HOSPITAL_COMMUNITY)
Admission: EM | Admit: 2022-03-22 | Discharge: 2022-03-23 | Disposition: A | Discharge status: Home / Self Care | Payer: Medicaid Other | Attending: Physician Assistant | Admitting: Physician Assistant

## 2022-03-22 DIAGNOSIS — R102 Pelvic and perineal pain: Secondary | ICD-10-CM | POA: Insufficient documentation

## 2022-03-22 DIAGNOSIS — Z5321 Procedure and treatment not carried out due to patient leaving prior to being seen by health care provider: Secondary | ICD-10-CM | POA: Diagnosis not present

## 2022-03-22 DIAGNOSIS — R1030 Lower abdominal pain, unspecified: Secondary | ICD-10-CM | POA: Diagnosis not present

## 2022-03-22 NOTE — ED Triage Notes (Signed)
Pelvic pain beginning today ~645.   Sts bladder pressure 1 week ago with urinary frequency. Suspected uti and began drinking cranberry juice.

## 2022-03-22 NOTE — ED Provider Triage Note (Signed)
Emergency Medicine Provider Triage Evaluation Note  Andrea Black , a 26 y.o. female  was evaluated in triage.  Pt complains of pelvic pain today.  That she had a UTI about a week ago, was taking cranberry juice to help with her symptoms without much improvement.  Today pain occurred suddenly, localized to the lower abdominal aspect, has not been taking anything for pain control.  Last menstrual cycle earlier this month, did take a pregnancy test a week ago which was negative..  Review of Systems  Positive: Abdominal pain, nausea, vomiting Negative: Chest pain, urinary symptoms, vaginal bleeding  Physical Exam  BP (!) 140/97 (BP Location: Left Arm)   Pulse 80   Temp 98.8 F (37.1 C) (Oral)   Resp 18   Ht '5\' 2"'$  (1.575 m)   Wt 93 kg   SpO2 99%   BMI 37.49 kg/m  Gen:   Awake, no distress   Resp:  Normal effort  MSK:   Moves extremities without difficulty  Other:  Minimal tenderness to palpation alon gthe lower abdomen.   Medical Decision Making  Medically screening exam initiated at 11:53 PM.  Appropriate orders placed.  Andrea Black was informed that the remainder of the evaluation will be completed by another provider, this initial triage assessment does not replace that evaluation, and the importance of remaining in the ED until their evaluation is complete.     Janeece Fitting, PA-C 03/22/22 2356

## 2022-03-23 ENCOUNTER — Other Ambulatory Visit: Payer: Self-pay

## 2022-03-23 ENCOUNTER — Encounter (HOSPITAL_COMMUNITY): Payer: Self-pay

## 2022-03-23 LAB — COMPREHENSIVE METABOLIC PANEL
ALT: 13 U/L (ref 0–44)
AST: 11 U/L — ABNORMAL LOW (ref 15–41)
Albumin: 4.3 g/dL (ref 3.5–5.0)
Alkaline Phosphatase: 58 U/L (ref 38–126)
Anion gap: 7 (ref 5–15)
BUN: 14 mg/dL (ref 6–20)
CO2: 25 mmol/L (ref 22–32)
Calcium: 9.2 mg/dL (ref 8.9–10.3)
Chloride: 104 mmol/L (ref 98–111)
Creatinine, Ser: 0.7 mg/dL (ref 0.44–1.00)
GFR, Estimated: >60 mL/min (ref >60)
Glucose, Bld: 240 mg/dL — ABNORMAL HIGH (ref 70–99)
Potassium: 3.6 mmol/L (ref 3.5–5.1)
Sodium: 136 mmol/L (ref 135–145)
Total Bilirubin: 0.4 mg/dL (ref 0.3–1.2)
Total Protein: 8.1 g/dL (ref 6.5–8.1)

## 2022-03-23 LAB — CBC WITH DIFFERENTIAL/PLATELET
Abs Immature Granulocytes: 0.01 10*3/uL (ref 0.00–0.07)
Basophils Absolute: 0 10*3/uL (ref 0.0–0.1)
Basophils Relative: 0 %
Eosinophils Absolute: 0.1 10*3/uL (ref 0.0–0.5)
Eosinophils Relative: 1 %
HCT: 31.7 % — ABNORMAL LOW (ref 36.0–46.0)
Hemoglobin: 10 g/dL — ABNORMAL LOW (ref 12.0–15.0)
Immature Granulocytes: 0 %
Lymphocytes Relative: 35 %
Lymphs Abs: 2.7 10*3/uL (ref 0.7–4.0)
MCH: 24.8 pg — ABNORMAL LOW (ref 26.0–34.0)
MCHC: 31.5 g/dL (ref 30.0–36.0)
MCV: 78.7 fL — ABNORMAL LOW (ref 80.0–100.0)
Monocytes Absolute: 0.5 10*3/uL (ref 0.1–1.0)
Monocytes Relative: 6 %
Neutro Abs: 4.4 10*3/uL (ref 1.7–7.7)
Neutrophils Relative %: 58 %
Platelets: 361 10*3/uL (ref 150–400)
RBC: 4.03 MIL/uL (ref 3.87–5.11)
RDW: 15.1 % (ref 11.5–15.5)
WBC: 7.7 10*3/uL (ref 4.0–10.5)
nRBC: 0 % (ref 0.0–0.2)

## 2022-03-23 LAB — URINALYSIS, ROUTINE W REFLEX MICROSCOPIC
Bilirubin Urine: NEGATIVE
Glucose, UA: >=500 mg/dL — AB
Hgb urine dipstick: NEGATIVE
Ketones, ur: NEGATIVE mg/dL
Nitrite: NEGATIVE
Protein, ur: 100 mg/dL — AB
Specific Gravity, Urine: 1.006 (ref 1.005–1.030)
WBC, UA: >50 WBC/hpf — ABNORMAL HIGH (ref 0–5)
pH: 7 (ref 5.0–8.0)

## 2022-03-23 LAB — I-STAT BETA HCG BLOOD, ED (MC, WL, AP ONLY): I-stat hCG, quantitative: <5 m[IU]/mL (ref <5)

## 2022-03-23 LAB — LIPASE, BLOOD: Lipase: 27 U/L (ref 11–51)

## 2022-03-27 ENCOUNTER — Ambulatory Visit (HOSPITAL_COMMUNITY)
Admission: EM | Admit: 2022-03-27 | Discharge: 2022-03-27 | Disposition: A | Discharge status: Home / Self Care | Payer: Medicaid Other | Attending: Urgent Care | Admitting: Urgent Care

## 2022-03-27 ENCOUNTER — Encounter (HOSPITAL_COMMUNITY): Payer: Self-pay

## 2022-03-27 DIAGNOSIS — D649 Anemia, unspecified: Secondary | ICD-10-CM | POA: Insufficient documentation

## 2022-03-27 DIAGNOSIS — R82998 Other abnormal findings in urine: Secondary | ICD-10-CM | POA: Insufficient documentation

## 2022-03-27 DIAGNOSIS — E1169 Type 2 diabetes mellitus with other specified complication: Secondary | ICD-10-CM

## 2022-03-27 DIAGNOSIS — B3731 Acute candidiasis of vulva and vagina: Secondary | ICD-10-CM | POA: Diagnosis not present

## 2022-03-27 DIAGNOSIS — E1165 Type 2 diabetes mellitus with hyperglycemia: Secondary | ICD-10-CM | POA: Insufficient documentation

## 2022-03-27 DIAGNOSIS — N739 Female pelvic inflammatory disease, unspecified: Secondary | ICD-10-CM | POA: Diagnosis not present

## 2022-03-27 LAB — CBG MONITORING, ED: Glucose-Capillary: 281 mg/dL — ABNORMAL HIGH (ref 70–99)

## 2022-03-27 LAB — POCT URINALYSIS DIPSTICK, ED / UC
Bilirubin Urine: NEGATIVE
Glucose, UA: 500 mg/dL — AB
Hgb urine dipstick: NEGATIVE
Nitrite: NEGATIVE
Protein, ur: NEGATIVE mg/dL
Specific Gravity, Urine: 1.02 (ref 1.005–1.030)
Urobilinogen, UA: 0.2 mg/dL (ref 0.0–1.0)
pH: 6 (ref 5.0–8.0)

## 2022-03-27 MED ORDER — CEFTRIAXONE SODIUM 1 G IJ SOLR
INTRAMUSCULAR | Status: AC
  Filled 2022-03-27: qty 10

## 2022-03-27 MED ORDER — METRONIDAZOLE 500 MG PO TABS: 500.0000 mg | ORAL_TABLET | Freq: Two times a day (BID) | ORAL | 0 refills | Status: AC

## 2022-03-27 MED ORDER — METFORMIN HCL ER 500 MG PO TB24: 500.0000 mg | ORAL_TABLET | Freq: Two times a day (BID) | ORAL | 0 refills | Status: AC

## 2022-03-27 MED ORDER — LIDOCAINE HCL (PF) 1 % IJ SOLN
INTRAMUSCULAR | Status: AC
  Filled 2022-03-27: qty 2

## 2022-03-27 MED ORDER — DOXYCYCLINE HYCLATE 100 MG PO CAPS: 100.0000 mg | ORAL_CAPSULE | Freq: Two times a day (BID) | ORAL | 0 refills | Status: AC

## 2022-03-27 MED ORDER — CEFTRIAXONE SODIUM 500 MG IJ SOLR
INTRAMUSCULAR | Status: AC
  Filled 2022-03-27: qty 500

## 2022-03-27 MED ORDER — FLUCONAZOLE 150 MG PO TABS: 150.0000 mg | ORAL_TABLET | Freq: Every day | ORAL | 0 refills | Status: AC

## 2022-03-27 MED ORDER — CEFTRIAXONE SODIUM 1 G IJ SOLR
1.0000 g | Freq: Once | INTRAMUSCULAR | Status: AC
  Administered 2022-03-27: 1 g via INTRAMUSCULAR

## 2022-03-27 MED ORDER — TAMSULOSIN HCL 0.4 MG PO CAPS: 0.4000 mg | ORAL_CAPSULE | Freq: Every day | ORAL | 0 refills | Status: AC

## 2022-03-27 NOTE — ED Provider Notes (Signed)
[facility]    CSN: [id number] Arrival date & time: 03/27/22  1009      History   Chief Complaint Chief Complaint  Patient presents with   Urinary Tract Infection    HPI Andrea Black is a 26 y.o. female.   26 year old female presents today due to concerns of possible UTI.  States she is been having some pelvic symptoms for the past 2 weeks.  Went to the emergency room on 9/27 and had triage assessment performed.  Pt did not stay for evaluation and left without being seen.  Patient presents today due to worsening symptoms.  She reports ongoing pelvic pressure, worse on the left than the right.  Any palpation of her lower abdomen causes discomfort.  Her urine sample in the ER showed leukocytes, glucose, crystals.  Patient reports a history of borderline diabetes, used to be on metformin but is not currently taking.  She reports some vaginal discharge and itching.  She has not been using over-the-counter medications for her symptoms.  She denies a known history of kidney stones.  States on Wednesday this past week she did have severe left flank pain, but that seemed to dissipate and is now gone.  She denies a fever or hematuria. Negative pregnancy test 3 days ago.   Urinary Tract Infection   Past Medical History:  Diagnosis Date   Anemia    Depression    Diabetes mellitus without complication ([place])    pre-diabetic    Headache in pregnancy, antepartum, second trimester 02/12/2020   Polycystic ovary     Patient Active Problem List   Diagnosis Date Noted   COVID-19 affecting pregnancy in third trimester 07/05/2020   Transaminitis 07/04/2020   Gestational hypertension 07/02/2020   History of herpes genitalis 07/02/2020   Headache in pregnancy, antepartum, second trimester 02/12/2020   Group B streptococcal bacteriuria 01/06/2020   Pre-existing diabetes mellitus affecting pregnancy in third trimester, antepartum 01/03/2020   Supervision of normal first pregnancy  11/14/2019   Exposure to sexually transmitted disease (STD) 02/15/2017   Polycystic ovarian disease 04/14/2016   Obesity affecting pregnancy, antepartum 04/01/2016    Past Surgical History:  Procedure Laterality Date   CESAREAN SECTION N/A 07/05/2020   Procedure: CESAREAN SECTION;  Surgeon: Aletha Halim, MD;  Location: [facility];  Service: Obstetrics;  Laterality: N/A;   WISDOM TOOTH EXTRACTION      OB History     Gravida  1   Para  1   Term  1   Preterm  0   AB  0   Living  1      SAB  0   IAB  0   Ectopic  0   Multiple  0   Live Births  1            Home Medications    Prior to Admission medications   Medication Sig Start Date End Date Taking? Authorizing Provider  doxycycline (VIBRAMYCIN) 100 MG capsule Take 1 capsule (100 mg total) by mouth 2 (two) times daily for 14 days. 03/27/22 04/10/22 Yes Lyndee Herbst L, PA  fluconazole (DIFLUCAN) 150 MG tablet Take 1 tablet (150 mg total) by mouth daily. May repeat in 72 hours if needed 03/27/22  Yes Sharanda Shinault L, PA  metFORMIN (GLUCOPHAGE-XR) 500 MG 24 hr tablet Take 1 tablet (500 mg total) by mouth 2 (two) times daily with a meal. 03/27/22  Yes Gyneth Hubka L, PA  metroNIDAZOLE (FLAGYL) 500 MG tablet Take 1  tablet (500 mg total) by mouth 2 (two) times daily for 14 days. 03/27/22 04/10/22 Yes Passion Lavin L, PA  tamsulosin (FLOMAX) 0.4 MG CAPS capsule Take 1 capsule (0.4 mg total) by mouth daily after breakfast for 7 days. 03/27/22 04/03/22 Yes Merideth Bosque L, PA  valACYclovir (VALTREX) 500 MG tablet TAKE 1 TABLET BY MOUTH 2 (TWO) TIMES DAILY. TAKE FOR 3 DAYS AS NEEDED FOR EACH RECURRENCE. 10/18/21  Yes Shelly Bombard, MD    Family History Family History  Problem Relation Age of Onset   Colon cancer Mother    Diabetes Mother    Arthritis Mother    Cancer Mother    Depression Mother    Stroke Mother    Heart disease Father    Asthma Father    Hypertension Father    Polycystic ovary syndrome  Paternal Grandmother    Alcohol abuse Brother     Social History Social History   Tobacco Use   Smoking status: Former    Types: Cigarettes    Quit date: 11/04/2019    Years since quitting: 2.3   Smokeless tobacco: Never  Vaping Use   Vaping Use: Never used  Substance Use Topics   Alcohol use: No    Alcohol/week: 0.0 standard drinks of alcohol   Drug use: Yes    Types: Marijuana     Allergies   Patient has no known allergies.   Review of Systems Review of Systems As per HPI  Physical Exam Triage Vital Signs ED Triage Vitals  Enc Vitals Group     BP 03/27/22 1118 113/85     Pulse Rate 03/27/22 1118 74     Resp 03/27/22 1118 16     Temp 03/27/22 1118 99.2 F (37.3 C)     Temp Source 03/27/22 1118 Oral     SpO2 03/27/22 1118 100 %     Weight --      Height --      Head Circumference --      Peak Flow --      Pain Score 03/27/22 1121 6     Pain Loc --      Pain Edu? --      Excl. in [place]? --    No data found.  Updated Vital Signs BP 113/85 (BP Location: Left Arm)   Pulse 74   Temp 99.2 F (37.3 C) (Oral)   Resp 16   LMP 03/02/2022 (Exact Date)   SpO2 100%   Visual Acuity Right Eye Distance:   Left Eye Distance:   Bilateral Distance:    Right Eye Near:   Left Eye Near:    Bilateral Near:     Physical Exam Vitals and nursing note reviewed. Exam conducted with a chaperone present.  Constitutional:      General: She is not in acute distress (mildly uncomfortable seated upright on exam table).    Appearance: Normal appearance. She is well-developed. She is obese. She is not ill-appearing, toxic-appearing or diaphoretic.  HENT:     Head: Normocephalic and atraumatic.     Mouth/Throat:     Mouth: Mucous membranes are moist.     Pharynx: Oropharynx is clear. No oropharyngeal exudate.  Eyes:     Extraocular Movements: Extraocular movements intact.     Conjunctiva/sclera: Conjunctivae normal.     Pupils: Pupils are equal, round, and reactive to  light.  Cardiovascular:     Rate and Rhythm: Normal rate and regular rhythm.     Heart  sounds: No murmur heard. Pulmonary:     Effort: Pulmonary effort is normal. No respiratory distress.     Breath sounds: Normal breath sounds.  Abdominal:     General: Abdomen is flat. Bowel sounds are normal. There is no distension.     Palpations: Abdomen is soft. There is no mass.     Tenderness: There is abdominal tenderness (L/R adnexa/ L/RLQ pain). There is no right CVA tenderness, left CVA tenderness, guarding or rebound.     Hernia: No hernia is present.  Genitourinary:    General: Normal vulva.     Pubic Area: Rash (white, ashy and curd like material present around vulva, labia minora and majora) present.     Labia:        Right: Rash present. No lesion.        Left: Rash present. No lesion.      Urethra: No prolapse or urethral swelling.     Vagina: Vaginal discharge and tenderness present.     Cervix: Cervical motion tenderness and discharge present.     Uterus: Tender.      Adnexa:        Right: Tenderness present. No mass.         Left: Tenderness present. No mass.       Rectum: Normal.  Musculoskeletal:        General: No swelling.     Cervical back: Neck supple.  Lymphadenopathy:     Cervical: No cervical adenopathy.     Lower Body: No right inguinal adenopathy. No left inguinal adenopathy.  Skin:    General: Skin is warm and dry.     Capillary Refill: Capillary refill takes less than 2 seconds.  Neurological:     Mental Status: She is alert.  Psychiatric:        Mood and Affect: Mood normal.      UC Treatments / Results  Labs (all labs ordered are listed, but only abnormal results are displayed) Labs Reviewed  POCT URINALYSIS DIPSTICK, ED / UC - Abnormal; Notable for the following components:      Result Value   Glucose, UA 500 (*)    Ketones, ur TRACE (*)    Leukocytes,Ua SMALL (*)    All other components within normal limits  CBG MONITORING, ED - Abnormal;  Notable for the following components:   Glucose-Capillary 281 (*)    All other components within normal limits  URINALYSIS, ROUTINE W REFLEX MICROSCOPIC  CERVICOVAGINAL ANCILLARY ONLY    EKG   Radiology No results found.  Procedures Procedures (including critical care time)  Medications Ordered in UC Medications  cefTRIAXone (ROCEPHIN) injection 1 g (1 g Intramuscular Given 03/27/22 1232)    Initial Impression / Assessment and Plan / UC Course  I have reviewed the triage vital signs and the nursing notes.  Pertinent labs & imaging results that were available during my care of the patient were reviewed by me and considered in my medical decision making (see chart for details).     PID - clinical dx. Pt is married, monogamous. Question if BV or yeast related. (+) chandelier sign. Will give rocephin in office, DC home with doxy and flagyl.  Crystalluria -this was noted in the emergency room.  Unable to obtain microscopy at urgent care, recommended establish care with a PCP and have further work-up performed.  Given symptoms consistent with possible passage of a kidney stone, I have started tamsulosin.  Encourage patient to switch to only water. Candidiasis -  likely secondary to uncontrolled diabetes mellitus.  Diflucan x1 dose given, and additional dose given if needed in the next 72 hours. Type 2 diabetes -glucose 281 in office, patient is fasting.  Used to be on metformin but stopped due to intolerance.  We will start metformin ER to help with any GI side effects.  Spent an extensive amount of time discussing carbohydrate sources and dietary changes with patient and husband.  Encouraged PCP follow-up and referral to dietitian for further education on meal planning. Anemia -chronic history of such.  Patient used to be on a iron, no longer taking.  Prior hemoglobins were in the sevens, therefore a 10.0 hemoglobin recently appears improved.  Again recommended PCP follow-up.   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  We have obtained a vaginal swab to assess for possible causes of this.  Please do not resume intercourse until results have been received. You were given an injection today called Rocephin.  This should cover any pelvic or kidney infection.  We are unable to send your urine for microscopy.  I would recommend a further follow-up with a primary care physician for assessment of possible kidney stones.  I have given you a prescription medication today called Flomax, which should help you pass the stone if you have 1.  Please get a urine strainer and strain your urine to possibly obtain the stone.  You have a yeast infection of the external and internal vagina.  This is likely secondary to your uncontrolled diabetes.  I have called in a medication called Diflucan for you to take today.  Your blood sugar fasting is 281.  This is consistent with uncontrolled diabetes mellitus.  I have called in metformin for you to start taking, 1 tablet in the morning with breakfast and another tablet at dinner.  Take with food to prevent upset stomach.  You must follow-up with your primary care physician to obtain proper control of your diabetes. Please visit diabetes.org to further look for information regarding DM control and ask for free pamphlets to be sent to your house.  Your lab work from the ER indicated anemia.  Your last hemoglobin was 10.0.  Please also have this monitored by your PCP.     ED Prescriptions     Medication Sig Dispense Auth. Provider   metroNIDAZOLE (FLAGYL) 500 MG tablet Take 1 tablet (500 mg total) by mouth 2 (two) times daily for 14 days.  28 tablet Marlayna Bannister L, PA   doxycycline (VIBRAMYCIN) 100 MG capsule Take 1 capsule (100 mg total) by mouth 2 (two) times daily for 14 days. 28 capsule Oday Ridings L, PA   tamsulosin (FLOMAX) 0.4 MG CAPS capsule Take 1 capsule (0.4 mg total) by mouth daily after breakfast for 7 days. 7 capsule Casandra Dallaire L, PA   fluconazole (DIFLUCAN) 150 MG tablet Take 1 tablet (150 mg total) by mouth daily. May repeat in 72 hours if needed 2 tablet Marylen Zuk L, PA   metFORMIN (GLUCOPHAGE-XR) 500 MG 24 hr tablet Take 1 tablet (500 mg total) by mouth 2 (two) times daily with a meal. 60 tablet Chaelyn Bunyan L, PA      PDMP not  reviewed this encounter.   Chaney Malling, [place] 03/27/22 1336

## 2022-03-27 NOTE — ED Triage Notes (Signed)
Urinary frequency with decreased output, left flank pain. Patient was seen Wednesday at the ED for abdominal pain. Patient was diagnosed with UTI no treatment.

## 2022-03-27 NOTE — Discharge Instructions (Addendum)
You are being treated today for pelvic inflammatory disease.  We have obtained a vaginal swab to assess for possible causes of this.  Please do not resume intercourse until results have been received. You were given an injection today called Rocephin.  This should cover any pelvic or kidney infection.  We are unable to send your urine for microscopy.  I would recommend a further follow-up with a primary care physician for assessment of possible kidney stones.  I have given you a prescription medication today called Flomax, which should help you pass the stone if you have 1.  Please get a urine strainer and strain your urine to possibly obtain the stone.  You have a yeast infection of the external and internal vagina.  This is likely secondary to your uncontrolled diabetes.  I have called in a medication called Diflucan for you to take today.  Your blood sugar fasting is 281.  This is consistent with uncontrolled diabetes mellitus.  I have called in metformin for you to start taking, 1 tablet in the morning with breakfast and another tablet at dinner.  Take with food to prevent upset stomach.  You must follow-up with your primary care physician to obtain proper control of your diabetes. Please visit [email or web address] to further look for information regarding DM control and ask for free pamphlets to be sent to your house.  Your lab work from the ER indicated anemia.  Your last hemoglobin was 10.0.  Please also have this monitored by your PCP.

## 2022-03-28 LAB — CERVICOVAGINAL ANCILLARY ONLY
Bacterial Vaginitis (gardnerella): NEGATIVE
Candida Glabrata: NEGATIVE
Candida Vaginitis: POSITIVE — AB
Chlamydia: NEGATIVE
Comment: NEGATIVE
Comment: NEGATIVE
Comment: NEGATIVE
Comment: NEGATIVE
Comment: NEGATIVE
Comment: NORMAL
Neisseria Gonorrhea: NEGATIVE
Trichomonas: NEGATIVE

## 2022-05-14 IMAGING — US US MFM FETAL BPP W/O NON-STRESS
1 series · 13 of 28 positions shown · non-contrast
Comparison: none

[Series 1: us mfm fetal bpp w/o non-stress · 34 acquisitions, 13 frames shown]
[im 2/34]
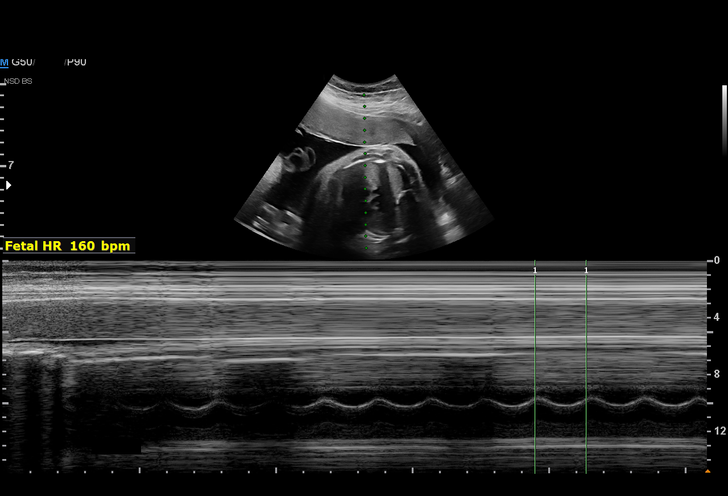
[im 4/34]
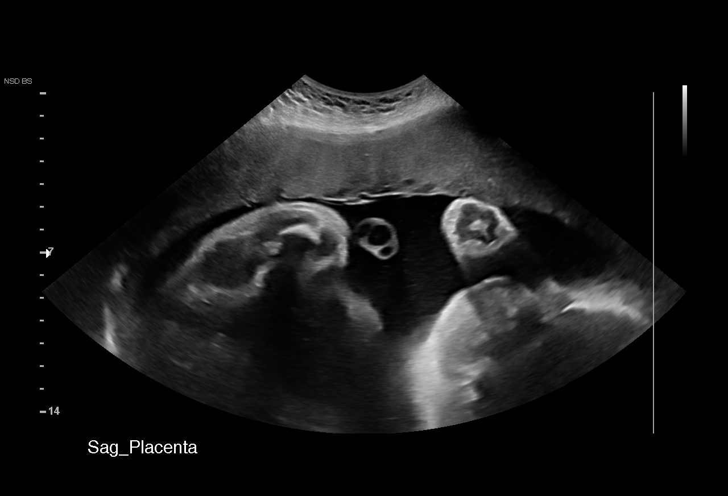
[im 7/34]
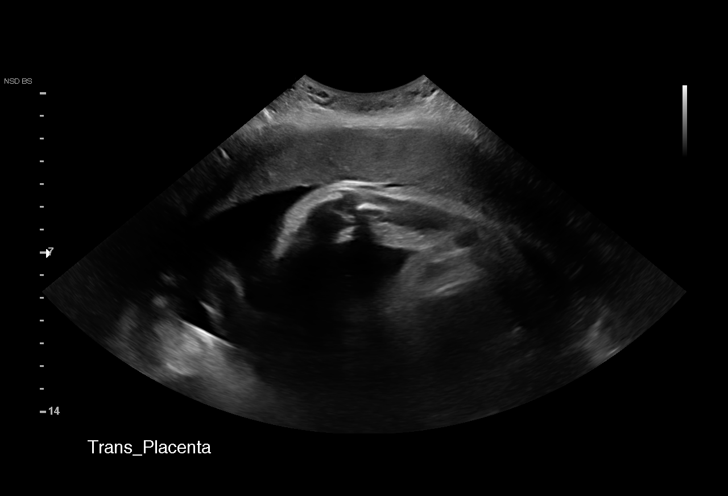
[im 9/34]
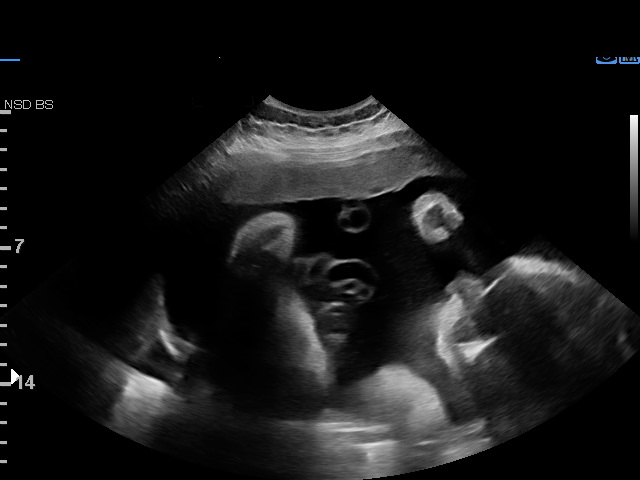
[im 12/34]
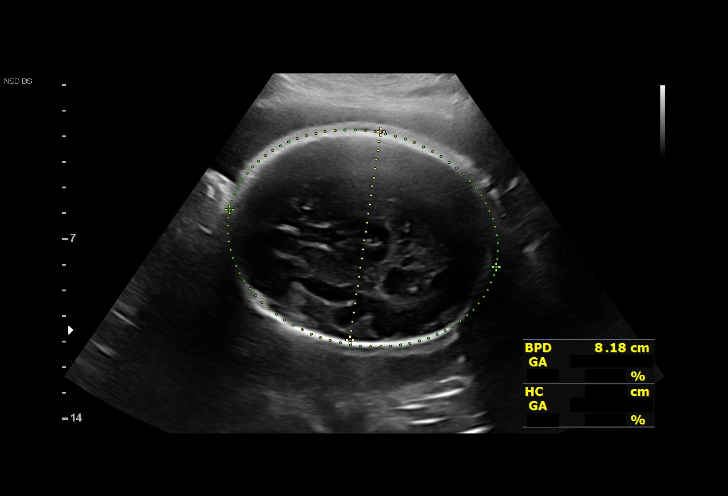
[im 14/34]
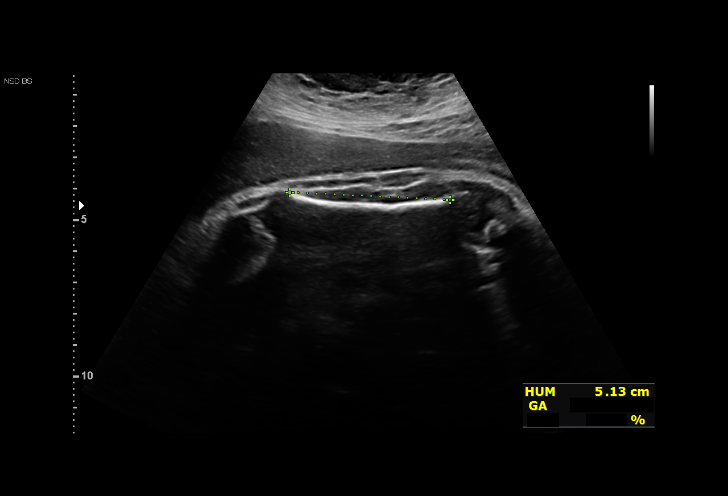
[im 18/34]
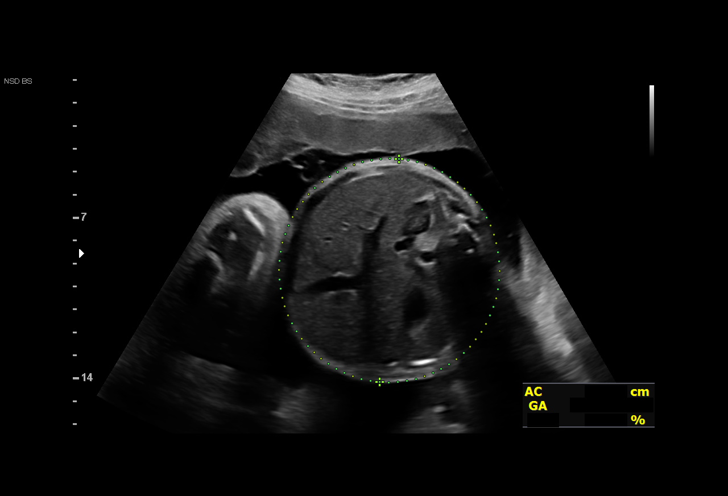
[im 20/34]
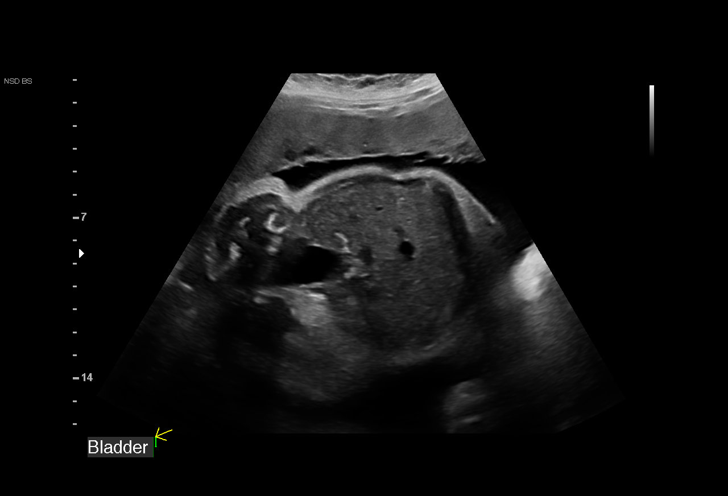
[im 23/34]
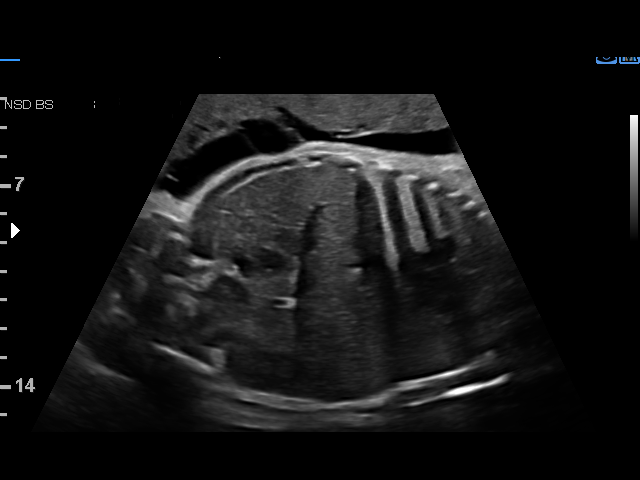
[im 25/34]
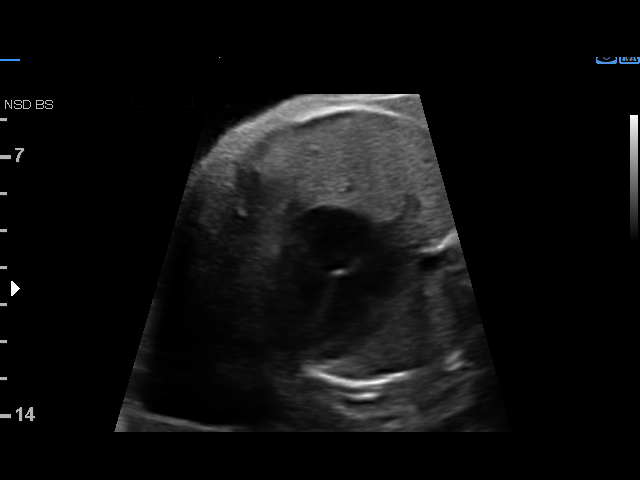
[im 27/34]
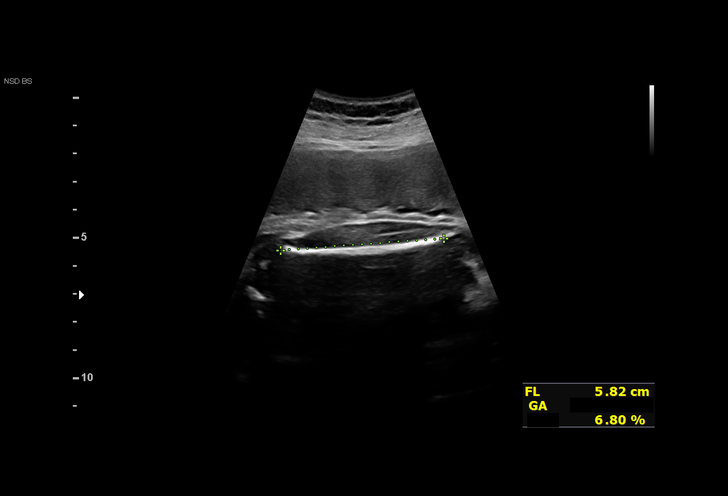
[im 30/34]
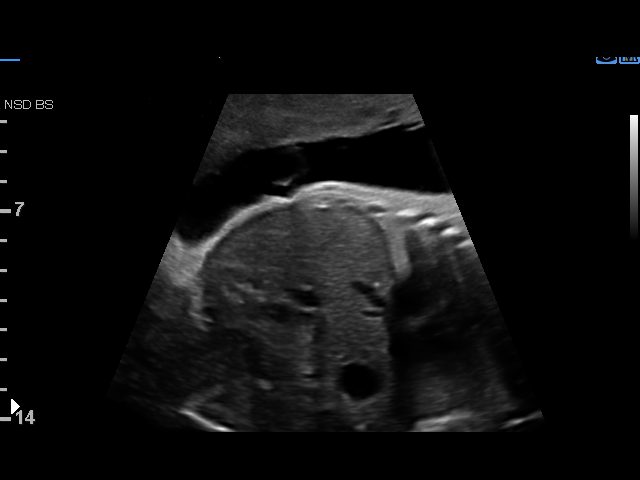
[im 32/34]
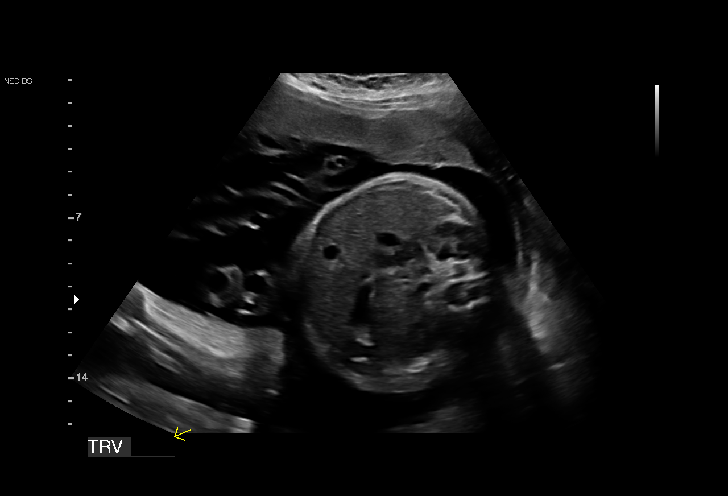

[13 of 28 positions shown; findings below may reference images not displayed]

Indications

 Encounter for other antenatal screening
 follow-up
 32 weeks gestation of pregnancy
 Pre-existing diabetes, type 2, in pregnancy,
 third trimester
 Obesity complicating pregnancy, third
 trimester (pregravid BMI 36)
 Polyhydramnios, third trimester, antepartum
 condition or complication, unspecified fetus
Vital Signs

                                                Height:        5'2"
Fetal Evaluation

 Num Of Fetuses:         1
 Fetal Heart Rate(bpm):  160
 Cardiac Activity:       Observed
 Presentation:           Cephalic
 Placenta:               Anterior
 P. Cord Insertion:      Previously Visualized

 Amniotic Fluid
 AFI FV:      Polyhydramnios

 AFI Sum(cm)     %Tile       Largest Pocket(cm)
 26.67           97

 RUQ(cm)       RLQ(cm)       LUQ(cm)        LLQ(cm)

Biophysical Evaluation

 Amniotic F.V:   Polyhydramnios             F. Tone:        Observed
 F. Movement:    Observed                   Score:          [DATE]
 F. Breathing:   Observed
Biometry

 BPD:      81.8  mm     G. Age:  32w 6d         68  %    CI:        74.46   %    70 - 86
                                                         FL/HC:      19.8   %    19.1 -
 HC:      300.9  mm     G. Age:  33w 3d         50  %    HC/AC:      0.99        0.96 -
 AC:      302.8  mm     G. Age:  34w 2d         96  %    FL/BPD:     72.9   %    71 - 87
 FL:       59.6  mm     G. Age:  31w 0d         15  %    FL/AC:      19.7   %    20 - 24
 HUM:        51  mm     G. Age:  29w 6d          5  %

 LV:        7.6  mm

 Est. FW:    8182  gm    4 lb 11 oz      76  %
OB History

 Gravidity:    1
Gestational Age

 LMP:           32w 0d        Date:  10/16/19                 EDD:   07/22/20
 U/S Today:     32w 6d                                        EDD:   07/16/20
 Best:          32w 0d     Det. By:  LMP  (10/16/19)          EDD:   07/22/20
Anatomy

 Cranium:               Appears normal         LVOT:                   Previously seen
 Cavum:                 Previously seen        Aortic Arch:            Previously seen
 Ventricles:            Appears normal         Ductal Arch:            Previously seen
 Choroid Plexus:        Previously seen        Diaphragm:              Appears normal
 Cerebellum:            Previously seen        Stomach:                Appears normal, left
                                                                       sided
 Posterior Fossa:       Previously seen        Abdomen:                Appears normal
 Nuchal Fold:           Previously seen        Abdominal Wall:         Previously seen
 Face:                  Orbits and profile     Cord Vessels:           Previously seen
                        previously seen
 Lips:                  Previously seen        Kidneys:                Appear normal
 Palate:                Not well visualized    Bladder:                Appears normal
 Thoracic:              Appears normal         Spine:                  Previously seen
 Heart:                 Appears normal         Upper Extremities:      Previously seen
                        (4CH, axis, and
                        situs)
 RVOT:                  Previously seen        Lower Extremities:      Previously seen

 Other:  Heels previously visualized. Open hands previously visualized. Nasal
         bone previously visualized. Technically difficult due to fetal position.
Cervix Uterus Adnexa

 Cervix
 Not visualized (advanced GA >59wks)
 Right Ovary
 Previously seen

 Left Ovary
 Previously seen.
Impression

 Pregestational diabetes.  Patient was prescribed insulin at
 her office but she reports she has not been taking insulin.
 She has not had her prenatal visit for more than a month.
 I am unable to assess her blood glucose control.

 Fetal growth is appropriate for gestational age.  Mild
 polyhydramnios is seen.  Antenatal testing is reassuring.
 BPP [DATE].

 I have reassured the patient of the findings and explained
 that polyhydramnios reflects suboptimal control of diabetes.  I
 discussed the importance of good blood glucose control to
 prevent adverse fetal or neonatal outcomes.  I encouraged
 her to make a prenatal visit appointment appointment as
 soon as possible.  Patient informed she will make an
 appointment today.

 She had questions about Valtrex prophylaxis.  If the patient
 has history of genital herpes infection, prophylactic Valtrex
 should be started at 35 to 36 weeks gestation.
Recommendations

 -Continue weekly BPP till delivery.
                 Karuna, Saidatu

## 2022-05-28 IMAGING — US US MFM FETAL BPP W/O NON-STRESS
1 series · 13 of 13 positions shown · non-contrast
Comparison: none

[Series 1: us mfm fetal bpp w/o non-stress · 13 acquisitions, 13 frames shown]
[im 1/13]
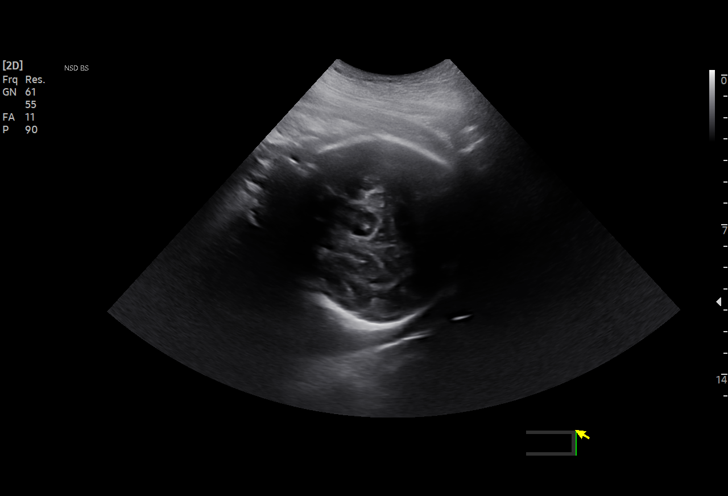
[im 2/13]
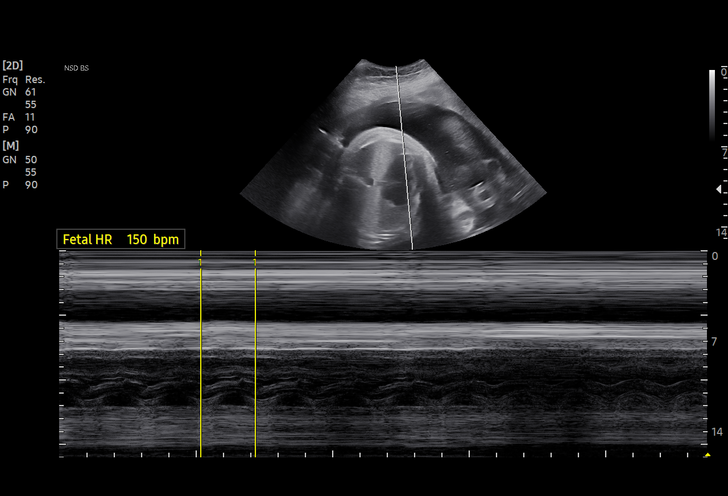
[im 3/13]
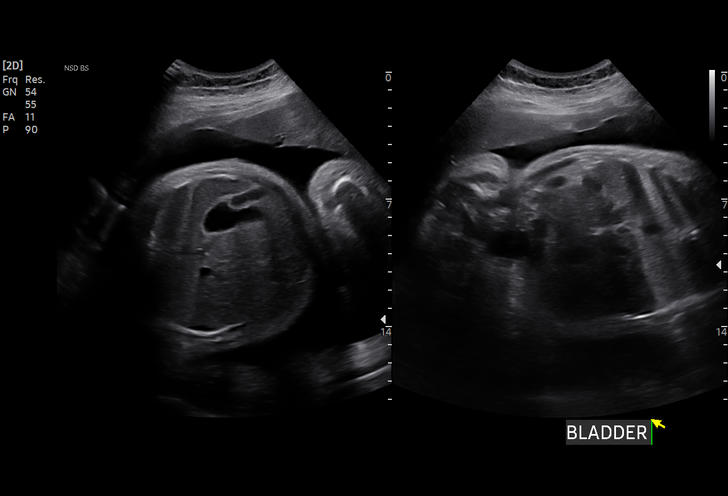
[im 4/13]
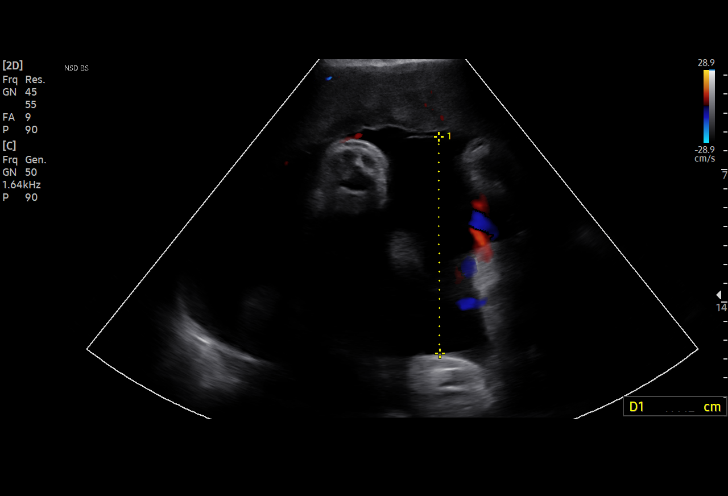
[im 5/13]
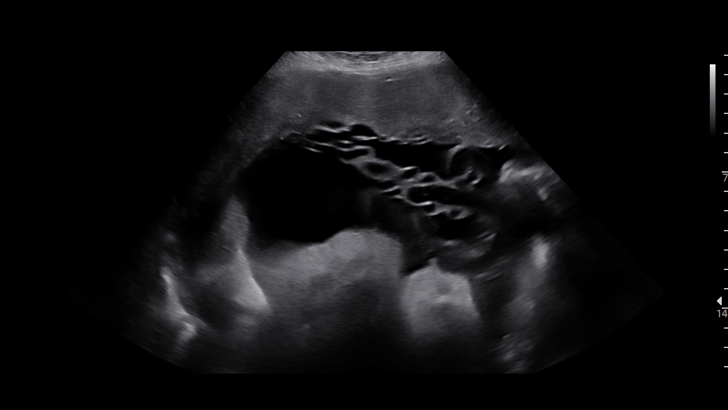
[im 6/13]
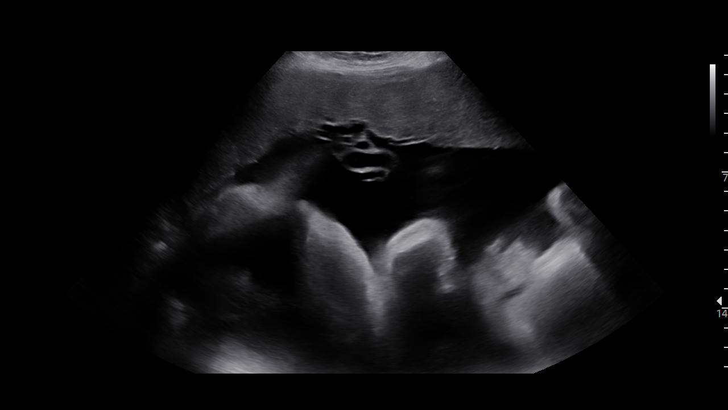
[im 7/13]
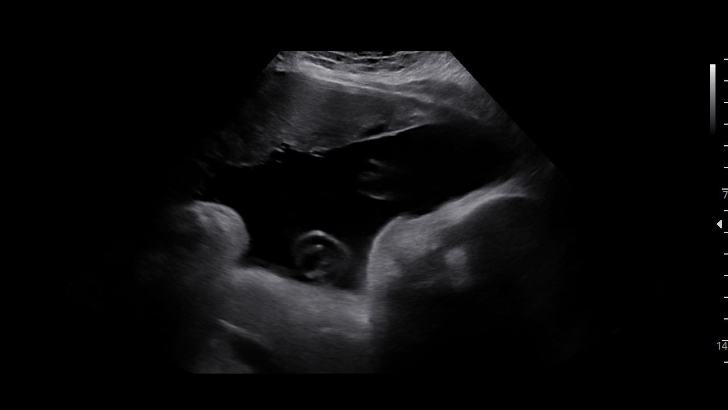
[im 8/13]
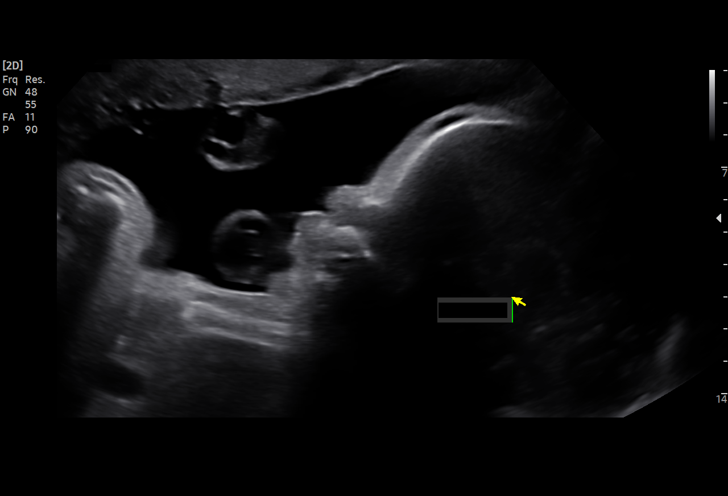
[im 9/13]
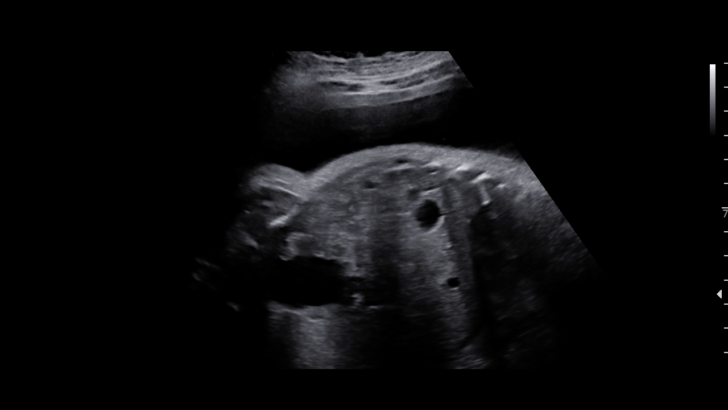
[im 10/13]
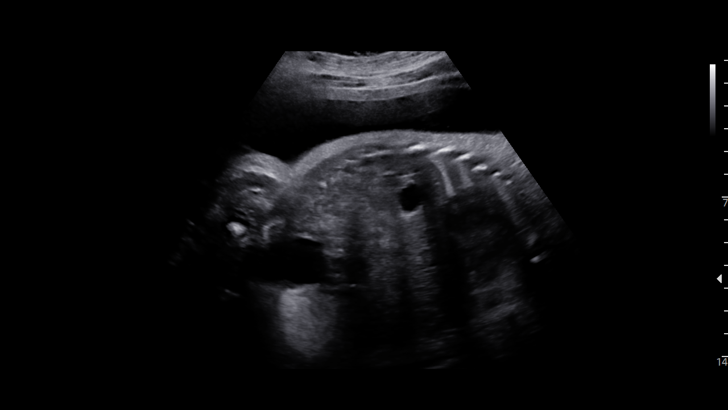
[im 11/13]
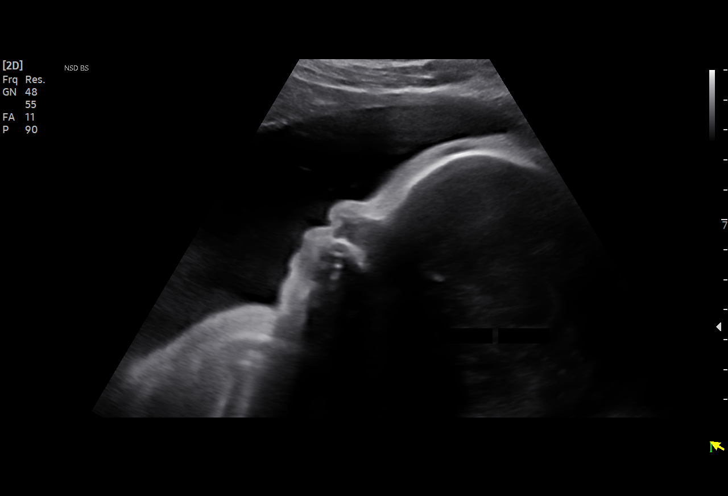
[im 12/13]
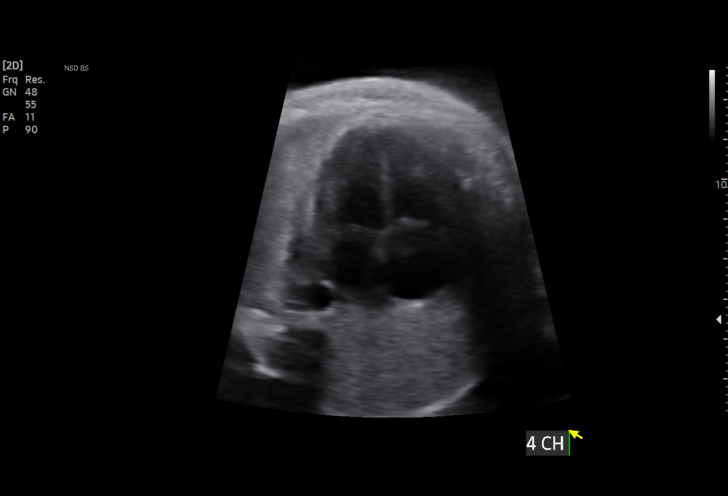
[im 13/13]
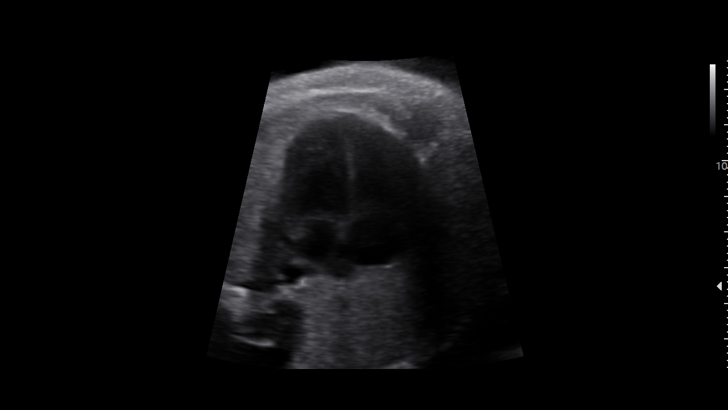

[13 of 13 positions shown; findings below may reference images not displayed]

Indications

 Encounter for other antenatal screening
 follow-up
 34 weeks gestation of pregnancy
 Pre-existing diabetes, type 2, in pregnancy,
 third trimester
 Obesity complicating pregnancy, third
 trimester (pregravid BMI 36)
 Polyhydramnios, third trimester, antepartum
 condition or complication, unspecified fetus
Vital Signs

                                                Height:        5'2"
Fetal Evaluation

 Num Of Fetuses:         1
 Fetal Heart Rate(bpm):  150
 Cardiac Activity:       Observed
 Presentation:           Cephalic

 Amniotic Fluid
 AFI FV:      Polyhydramnios

 AFI Sum(cm)     %Tile       Largest Pocket(cm)
 27.5            97

 RUQ(cm)       RLQ(cm)       LUQ(cm)        LLQ(cm)

Biophysical Evaluation
 Amniotic F.V:   Polyhydramnios             F. Tone:        Observed
 F. Movement:    Observed                   Score:          [DATE]
 F. Breathing:   Observed
OB History

 Gravidity:    1
Gestational Age

 LMP:           34w 0d        Date:  10/16/19                 EDD:   07/22/20
 Best:          34w 0d     Det. By:  LMP  (10/16/19)          EDD:   07/22/20
Impression

 Antenatal testing performed given maternal pregestational
 diabetes
 The biophysical profile was [DATE] with good fetal movement and
 amniotic fluid volume is again elevated today, therefore mild
 polyhydramnios is observed.

 Provider notes suggests that the patients blood glucose
 readings have been elevated and is not taking insulin
 because she has not been educated.

 She has been taking Metformin. Her last Ub5Vc was in [REDACTED].
 We have ordered a Ub5Vc today to assess her overall control.

 She reports good fetal movement.

 I discussed today's visit with Ms. Jumper, all questions
 answered.
Recommendations

 Continue weekly testing
 Consider growth in 2 weeks.
 Consider delivery between 37-39 weeks pending maternal
 and fetal blood

## 2022-06-18 IMAGING — US US MFM FETAL BPP W/O NON-STRESS
1 series · 15 of 28 positions shown · non-contrast
Comparison: none

[Series 1: us mfm fetal bpp w/o non-stress · 36 acquisitions, 15 frames shown]
[im 1/36]
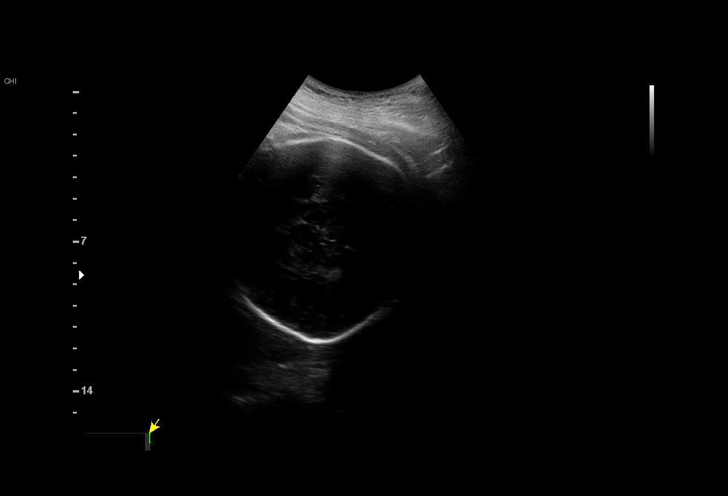
[im 3/36]
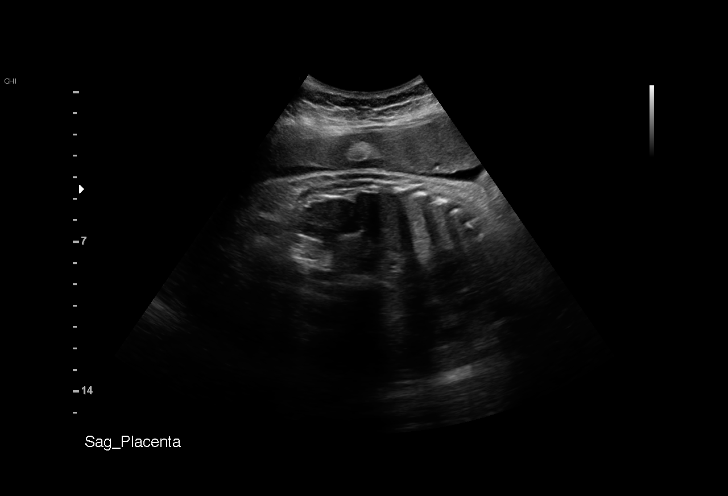
[im 6/36]
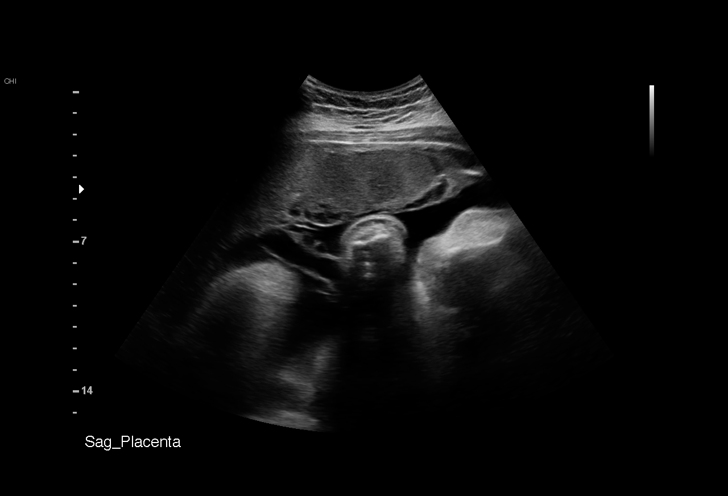
[im 8/36]
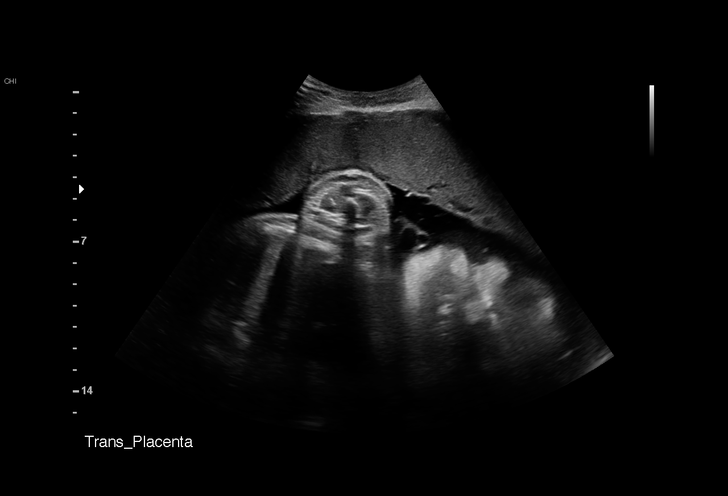
[im 11/36]
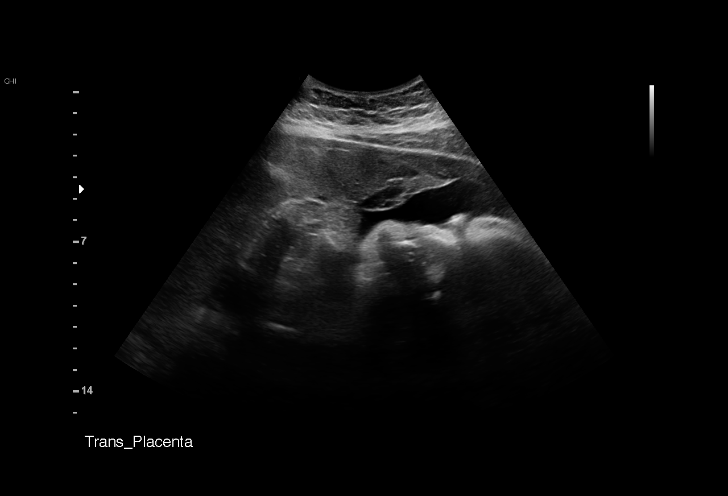
[im 13/36]
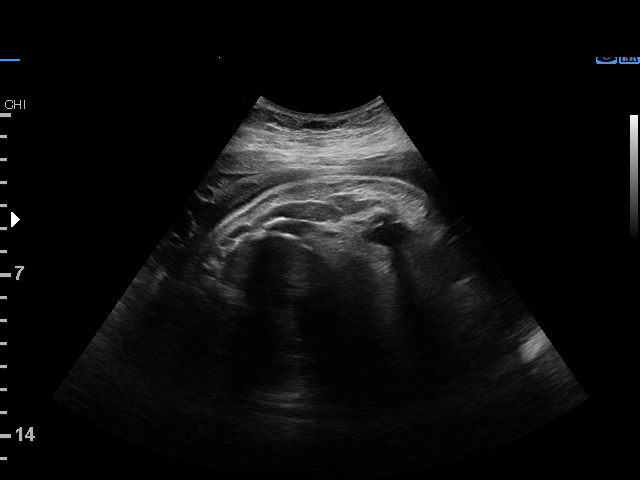
[im 16/36]
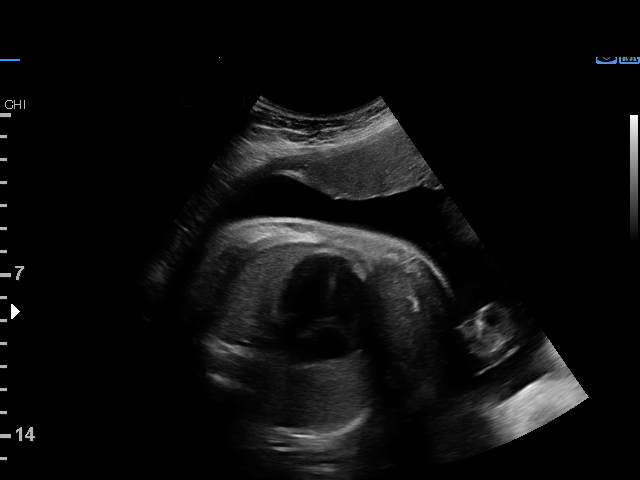
[im 19/36]
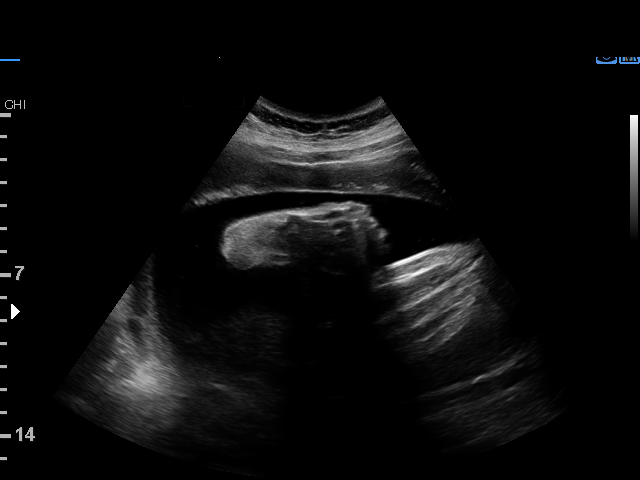
[im 20/36]
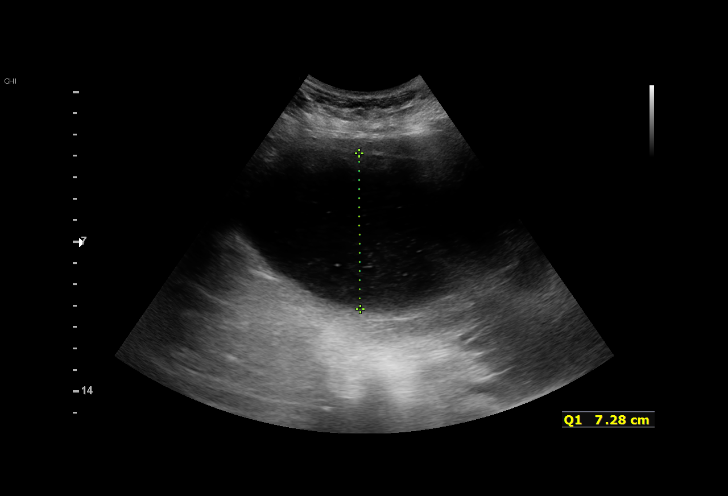
[im 23/36]
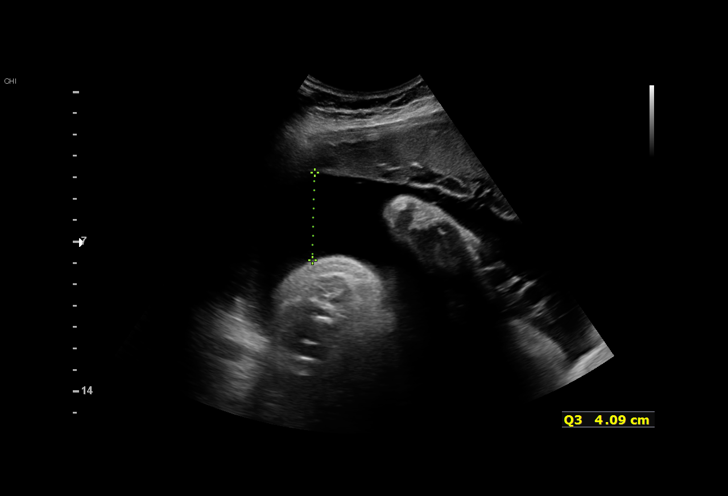
[im 25/36]
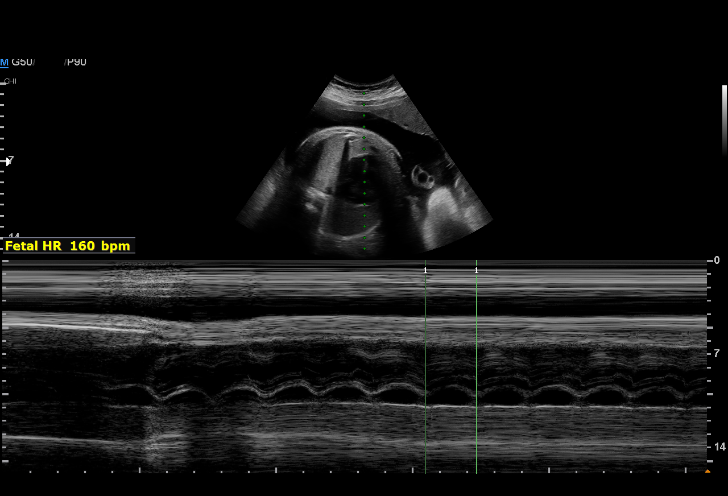
[im 28/36]
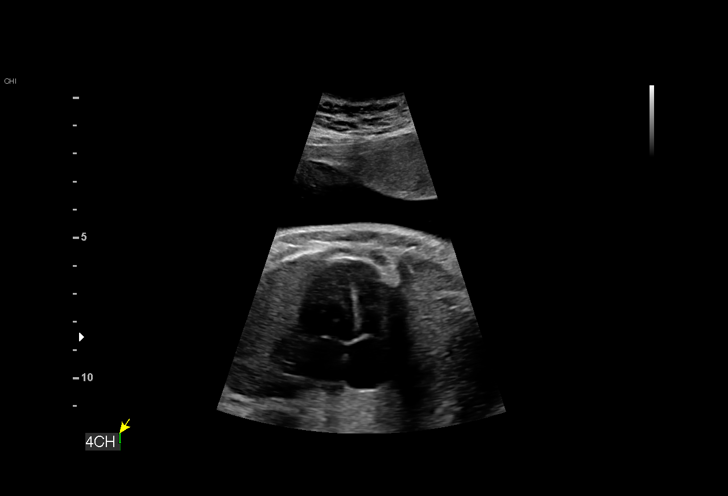
[im 30/36]
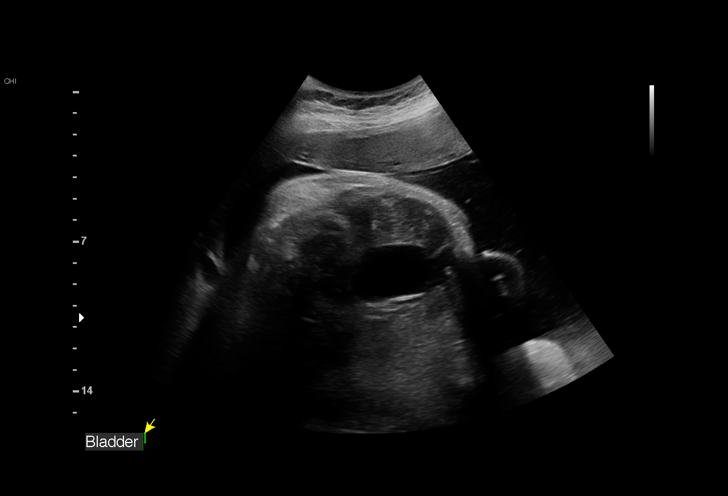
[im 33/36]
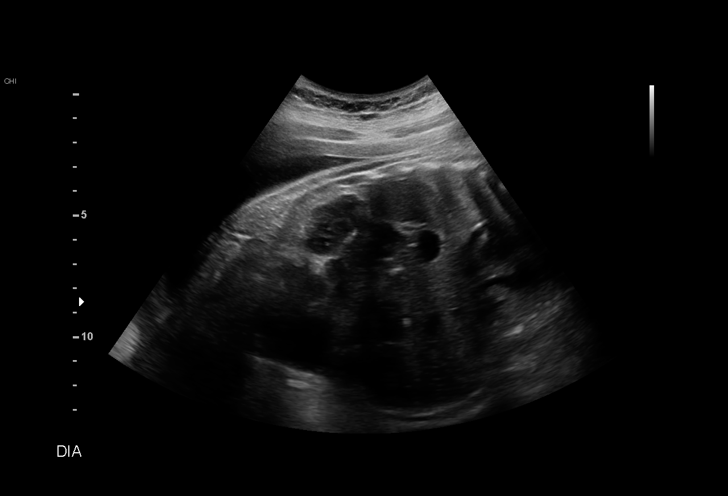
[im 36/36]
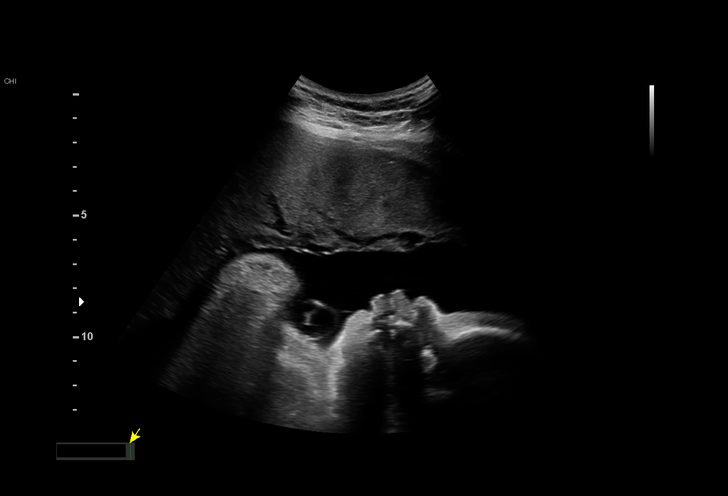

[15 of 28 positions shown; findings below may reference images not displayed]

Indications

 Pre-existing diabetes, type 2, in pregnancy,
 third trimester
 Polyhydramnios, third trimester, antepartum
 condition or complication, unspecified fetus
 Obesity complicating pregnancy, third
 trimester (pregravid BMI 36)
 37 weeks gestation of pregnancy
Vital Signs

                                                Height:        5'2"
Fetal Evaluation

 Num Of Fetuses:         1
 Fetal Heart Rate(bpm):  160
 Cardiac Activity:       Observed
 Presentation:           Cephalic
 Placenta:               Anterior
 P. Cord Insertion:      Previously Visualized

 Amniotic Fluid
 AFI FV:      Within normal limits

 AFI Sum(cm)     %Tile       Largest Pocket(cm)
 19.1            73
 RUQ(cm)       RLQ(cm)       LUQ(cm)        LLQ(cm)

Biophysical Evaluation

 Amniotic F.V:   Pocket => 2 cm             F. Tone:        Observed
 F. Movement:    Observed                   Score:          [DATE]
 F. Breathing:   Observed
OB History

 Gravidity:    1
Gestational Age

 LMP:           37w 0d        Date:  10/16/19                 EDD:   07/22/20
 Best:          37w 0d     Det. By:  LMP  (10/16/19)          EDD:   07/22/20
Anatomy

 Stomach:               Appears normal, left   Bladder:                Appears normal
                        sided
Cervix Uterus Adnexa

 Cervix
 Not visualized (advanced GA >47wks)
Comments

 This patient was seen for a biophysical profile due to
 pregestational diabetes that is currently treated with
 Metformin.  She reports that some of her fingerstick values
 have been elevated.
 A biophysical profile performed today was [DATE].
 There was normal amniotic fluid noted on today's ultrasound
 exam.
 Should her glycemic control remain poor, delivery may be
 considered at around 37 weeks.
 Another biophysical profile scheduled in 1 week.

## 2023-09-25 DIAGNOSIS — Z800 Family history of malignant neoplasm of digestive organs: Secondary | ICD-10-CM | POA: Diagnosis not present

## 2023-09-25 DIAGNOSIS — E282 Polycystic ovarian syndrome: Secondary | ICD-10-CM | POA: Diagnosis not present

## 2023-09-25 DIAGNOSIS — R7401 Elevation of levels of liver transaminase levels: Secondary | ICD-10-CM | POA: Diagnosis not present

## 2023-09-25 DIAGNOSIS — I10 Essential (primary) hypertension: Secondary | ICD-10-CM | POA: Diagnosis not present

## 2023-09-25 DIAGNOSIS — Z8619 Personal history of other infectious and parasitic diseases: Secondary | ICD-10-CM | POA: Diagnosis not present

## 2023-09-25 DIAGNOSIS — M62838 Other muscle spasm: Secondary | ICD-10-CM | POA: Diagnosis not present

## 2023-09-25 DIAGNOSIS — D649 Anemia, unspecified: Secondary | ICD-10-CM | POA: Diagnosis not present

## 2023-09-25 DIAGNOSIS — E1165 Type 2 diabetes mellitus with hyperglycemia: Secondary | ICD-10-CM | POA: Diagnosis not present

## 2023-10-13 DIAGNOSIS — I10 Essential (primary) hypertension: Secondary | ICD-10-CM | POA: Diagnosis not present

## 2023-10-13 DIAGNOSIS — I498 Other specified cardiac arrhythmias: Secondary | ICD-10-CM | POA: Diagnosis not present
